# Patient Record
Sex: Female | Born: 2000 | Race: White | Hispanic: No | Marital: Single | Smoking: Never smoker
Health system: Southern US, Community
[De-identification: ages and names within clinical notes are randomized; demographics above are authoritative.]

## PROBLEM LIST (undated history)

## (undated) DIAGNOSIS — L03211 Cellulitis of face: Secondary | ICD-10-CM

---
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
## 2018-03-07 ENCOUNTER — Other Ambulatory Visit: Payer: Self-pay | Admitting: Family Medicine

## 2018-03-07 ENCOUNTER — Ambulatory Visit
Admission: RE | Admit: 2018-03-07 | Discharge: 2018-03-07 | Disposition: A | Discharge status: Home / Self Care | Payer: BLUE CROSS/BLUE SHIELD | Source: Ambulatory Visit | Attending: Family Medicine | Admitting: Family Medicine

## 2018-03-07 ENCOUNTER — Ambulatory Visit (INDEPENDENT_AMBULATORY_CARE_PROVIDER_SITE_OTHER): Payer: BLUE CROSS/BLUE SHIELD | Admitting: Family Medicine

## 2018-03-07 ENCOUNTER — Encounter: Payer: Self-pay | Admitting: Family Medicine

## 2018-03-07 DIAGNOSIS — S8990XA Unspecified injury of unspecified lower leg, initial encounter: Secondary | ICD-10-CM | POA: Diagnosis not present

## 2018-03-07 DIAGNOSIS — S8992XA Unspecified injury of left lower leg, initial encounter: Secondary | ICD-10-CM

## 2018-03-07 DIAGNOSIS — Y9366 Activity, soccer: Secondary | ICD-10-CM | POA: Diagnosis not present

## 2018-03-07 MED ORDER — DICLOFENAC SODIUM 75 MG PO TBEC: 75.0000 mg | DELAYED_RELEASE_TABLET | Freq: Two times a day (BID) | ORAL | 0 refills | Status: DC
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
## 2018-03-08 ENCOUNTER — Ambulatory Visit
Admission: RE | Admit: 2018-03-08 | Discharge: 2018-03-08 | Disposition: A | Discharge status: Home / Self Care | Payer: BLUE CROSS/BLUE SHIELD | Source: Ambulatory Visit | Attending: Family Medicine | Admitting: Family Medicine

## 2018-03-08 DIAGNOSIS — M25562 Pain in left knee: Secondary | ICD-10-CM | POA: Diagnosis not present

## 2018-03-08 DIAGNOSIS — X58XXXA Exposure to other specified factors, initial encounter: Secondary | ICD-10-CM | POA: Insufficient documentation

## 2018-03-08 DIAGNOSIS — S8992XA Unspecified injury of left lower leg, initial encounter: Secondary | ICD-10-CM | POA: Diagnosis present

## 2018-03-08 DIAGNOSIS — S8255XA Nondisplaced fracture of medial malleolus of left tibia, initial encounter for closed fracture: Secondary | ICD-10-CM | POA: Insufficient documentation

## 2018-05-10 ENCOUNTER — Other Ambulatory Visit: Payer: Self-pay | Admitting: Family Medicine

## 2018-05-10 ENCOUNTER — Other Ambulatory Visit: Payer: Self-pay

## 2018-05-10 DIAGNOSIS — R0602 Shortness of breath: Secondary | ICD-10-CM

## 2018-05-10 DIAGNOSIS — R1030 Lower abdominal pain, unspecified: Secondary | ICD-10-CM

## 2018-05-10 DIAGNOSIS — R5381 Other malaise: Secondary | ICD-10-CM

## 2018-05-11 LAB — CBC WITH DIFFERENTIAL/PLATELET
BASOS: 1 %
Basophils Absolute: 0 10*3/uL (ref 0.0–0.2)
EOS (ABSOLUTE): 0 10*3/uL (ref 0.0–0.4)
EOS: 1 %
HEMATOCRIT: 41.1 % (ref 34.0–46.6)
HEMOGLOBIN: 14 g/dL (ref 11.1–15.9)
IMMATURE GRANS (ABS): 0 10*3/uL (ref 0.0–0.1)
IMMATURE GRANULOCYTES: 0 %
LYMPHS: 30 %
Lymphocytes Absolute: 1.9 10*3/uL (ref 0.7–3.1)
MCH: 30.4 pg (ref 26.6–33.0)
MCHC: 34.1 g/dL (ref 31.5–35.7)
MCV: 89 fL (ref 79–97)
MONOS ABS: 0.4 10*3/uL (ref 0.1–0.9)
Monocytes: 6 %
NEUTROS PCT: 62 %
Neutrophils Absolute: 4.2 10*3/uL (ref 1.4–7.0)
Platelets: 304 10*3/uL (ref 150–450)
RBC: 4.61 x10E6/uL (ref 3.77–5.28)
RDW: 12.1 % — AB (ref 12.3–15.4)
WBC: 6.6 10*3/uL (ref 3.4–10.8)

## 2018-05-11 LAB — SPECIMEN STATUS REPORT

## 2018-05-12 LAB — IRON AND TIBC
IRON: 110 ug/dL (ref 27–159)
Iron Saturation: 23 % (ref 15–55)
Total Iron Binding Capacity: 469 ug/dL — ABNORMAL HIGH (ref 250–450)
UIBC: 359 ug/dL (ref 131–425)

## 2018-05-12 LAB — SPECIMEN STATUS REPORT

## 2018-05-13 LAB — VITAMIN D 25 HYDROXY (VIT D DEFICIENCY, FRACTURES): Vit D, 25-Hydroxy: 68.5 ng/mL (ref 30.0–100.0)

## 2018-05-13 LAB — TSH: TSH: 2.43 u[IU]/mL (ref 0.450–4.500)

## 2018-05-13 LAB — SPECIMEN STATUS REPORT

## 2018-05-14 LAB — CELIAC DISEASE PANEL
ENDOMYSIAL IGA: NEGATIVE
IGA/IMMUNOGLOBULIN A, SERUM: 98 mg/dL (ref 87–352)
Transglutaminase IgA: <2 U/mL (ref 0–3)

## 2018-05-14 LAB — FERRITIN: FERRITIN: 23 ng/mL (ref 15–77)

## 2018-05-14 LAB — SPECIMEN STATUS REPORT

## 2018-09-05 ENCOUNTER — Encounter: Payer: Self-pay | Admitting: Emergency Medicine

## 2018-09-05 DIAGNOSIS — H02846 Edema of left eye, unspecified eyelid: Secondary | ICD-10-CM | POA: Diagnosis present

## 2018-09-05 DIAGNOSIS — H05012 Cellulitis of left orbit: Secondary | ICD-10-CM | POA: Insufficient documentation

## 2018-09-05 NOTE — ED Triage Notes (Signed)
[name] student seen by Joseph Art, MD advised to come to ED if left eye swelling worsened. Pt was seen by her this AM and started on Sulfameth/trimethopim 800/160mg  BID. Pt has taken 2 pills today. Pt reports worsening pain and swelling to the left eye. No drainage.

## 2018-09-06 ENCOUNTER — Emergency Department
Admission: EM | Admit: 2018-09-06 | Discharge: 2018-09-06 | Disposition: A | Discharge status: Home / Self Care | Payer: BLUE CROSS/BLUE SHIELD | Attending: Emergency Medicine | Admitting: Emergency Medicine

## 2018-09-06 ENCOUNTER — Other Ambulatory Visit: Payer: Self-pay

## 2018-09-06 DIAGNOSIS — H05012 Cellulitis of left orbit: Secondary | ICD-10-CM | POA: Diagnosis not present

## 2018-09-06 DIAGNOSIS — L03213 Periorbital cellulitis: Secondary | ICD-10-CM

## 2018-09-06 LAB — CBC
HCT: 41.1 % (ref 36.0–46.0)
Hemoglobin: 13.6 g/dL (ref 12.0–15.0)
MCH: 29.1 pg (ref 26.0–34.0)
MCHC: 33.1 g/dL (ref 30.0–36.0)
MCV: 88 fL (ref 80.0–100.0)
Platelets: 229 10*3/uL (ref 150–400)
RBC: 4.67 MIL/uL (ref 3.87–5.11)
RDW: 12.9 % (ref 11.5–15.5)
WBC: 8.8 10*3/uL (ref 4.0–10.5)
nRBC: 0 % (ref 0.0–0.2)

## 2018-09-06 LAB — COMPREHENSIVE METABOLIC PANEL
ALBUMIN: 4.6 g/dL (ref 3.5–5.0)
ALT: 24 U/L (ref 0–44)
AST: 24 U/L (ref 15–41)
Alkaline Phosphatase: 52 U/L (ref 38–126)
Anion gap: 10 (ref 5–15)
BUN: 16 mg/dL (ref 6–20)
CO2: 26 mmol/L (ref 22–32)
Calcium: 9.2 mg/dL (ref 8.9–10.3)
Chloride: 102 mmol/L (ref 98–111)
Creatinine, Ser: 0.94 mg/dL (ref 0.44–1.00)
GFR calc non Af Amer: >60 mL/min (ref >60)
Glucose, Bld: 92 mg/dL (ref 70–99)
Potassium: 3.5 mmol/L (ref 3.5–5.1)
SODIUM: 138 mmol/L (ref 135–145)
Total Bilirubin: 0.4 mg/dL (ref 0.3–1.2)
Total Protein: 7.4 g/dL (ref 6.5–8.1)

## 2018-09-06 MED ORDER — CLINDAMYCIN PHOSPHATE 900 MG/50ML IV SOLN
900.0000 mg | Freq: Once | INTRAVENOUS | Status: AC
  Administered 2018-09-06: 900 mg via INTRAVENOUS
  Filled 2018-09-06: qty 50

## 2018-09-06 MED ORDER — ACETAMINOPHEN 325 MG PO TABS
650.0000 mg | ORAL_TABLET | Freq: Once | ORAL | Status: AC
  Administered 2018-09-06: 650 mg via ORAL
  Filled 2018-09-06: qty 2

## 2018-09-06 MED ORDER — MUPIROCIN 2 % EX OINT: TOPICAL_OINTMENT | CUTANEOUS | 0 refills | Status: DC

## 2018-09-06 NOTE — ED Notes (Signed)
Pt in with co left eye redness and irritation, went to [facility] and dx with staph infection. Was started on antibiotics but was told to follow up for worsening symptoms.

## 2018-09-06 NOTE — ED Notes (Signed)
Report given to Cole RN 

## 2018-09-06 NOTE — ED Provider Notes (Signed)
[facility] Emergency Department Provider Note _______   First MD Initiated Contact with Patient 09/06/18 0236     (approximate)  I have reviewed the triage vital signs and the nursing notes.   HISTORY  Chief Complaint Facial Swelling   HPI Lougenia Curlee is a 18 y.o. female presents to the emergency department with history of complete to the left side of her face which patient states that after she "popped it" area became swollen and red and has progressively worsened since that time.  Patient was seen by [facility] clinic and prescribed Bactrim DS which patient states that she has taken 2 doses thus far and that area of swelling has worsened and now extending around the left eye with associated redness and discomfort.   Past medical history Recently diagnosed left facial cellulitis at [facility] There are no active problems to display for this patient.   Past Surgical History:  Procedure Laterality Date  . KNEE SURGERY Right 05/2015    Prior to Admission medications   Medication Sig Start Date End Date Taking? Authorizing Provider  sulfamethoxazole-trimethoprim (BACTRIM DS,SEPTRA DS) 800-160 MG tablet Take 2 tablets by mouth 2 (two) times daily. 09/05/18  Yes [provider]  albuterol (PROVENTIL HFA;VENTOLIN HFA) 108 (90 Base) MCG/ACT inhaler Inhale 2 puffs into the lungs every 6 (six) hours as needed for wheezing or shortness of breath. And 30 min before exercise. Patient not taking: Reported on 03/07/2018 11/01/17   Jolene Provost, MD  diclofenac (VOLTAREN) 75 MG EC tablet Take 1 tablet (75 mg total) by mouth 2 (two) times daily. Patient not taking: Reported on 09/06/2018 03/07/18   Jolene Provost, MD    Allergies Patient has no known allergies.  History reviewed. No pertinent family history.  Social History Social History   Tobacco Use  . Smoking status: Never Smoker  . Smokeless tobacco: Never Used  Substance Use Topics  .  Alcohol use: Yes  . Drug use: Not on file    Review of Systems Constitutional: No fever/chills Eyes: No visual changes.  Positive for left facial redness swelling and tenderness. ENT: No sore throat. Cardiovascular: Denies chest pain. Respiratory: Denies shortness of breath. Gastrointestinal: No abdominal pain.  No nausea, no vomiting.  No diarrhea.  No constipation. Genitourinary: Negative for dysuria. Musculoskeletal: Negative for neck pain.  Negative for back pain. Integumentary: Negative for rash. Neurological: Negative for headaches, focal weakness or numbness.   ____________________________________________   PHYSICAL EXAM:  VITAL SIGNS: ED Triage Vitals  Enc Vitals Group     BP 09/05/18 2344 (!) 146/73     Pulse Rate 09/05/18 2344 70     Resp 09/05/18 2344 17     Temp 09/05/18 2344 97.9 F (36.6 C)     Temp Source 09/05/18 2344 Oral     SpO2 09/05/18 2344 98 %     Weight 09/05/18 2344 63.5 kg (140 lb)     Height 09/05/18 2344 1.702 m (5\' 7" )     Head Circumference --      Peak Flow --      Pain Score 09/06/18 0229 6     Pain Loc --      Pain Edu? --      Excl. in GC? --    Constitutional: Alert and oriented. Well appearing and in no acute distress. Eyes: Conjunctivae are normal.  Left periorbital edema with associated erythema.  No pain with extraocular motion Head: Left facial blanching erythema and swelling extending  from the preauricular area with circumferential edema around the left eye Mouth/Throat: Mucous membranes are moist.  Oropharynx non-erythematous. Neck: No stridor.   Cardiovascular: Normal rate, regular rhythm. Good peripheral circulation. Grossly normal heart sounds. Respiratory: Normal respiratory effort.  No retractions. Lungs CTAB. Gastrointestinal: Soft and nontender. No distention.  Musculoskeletal: No lower extremity tenderness nor edema. No gross deformities of extremities. Neurologic:  Normal speech and language. No gross focal  neurologic deficits are appreciated.  Skin: Left facial blanching erythema with associated swelling extending from the preauricular area with circumferential edema around the left eye with associated tenderness   ____________________________________________   LABS (all labs ordered are listed, but only abnormal results are displayed)  Labs Reviewed  CULTURE, BLOOD (ROUTINE X 2)  CULTURE, BLOOD (ROUTINE X 2)  CBC  COMPREHENSIVE METABOLIC PANEL    Procedures   ____________________________________________   INITIAL IMPRESSION / ASSESSMENT AND PLAN / ED COURSE  As part of my medical decision making, I reviewed the following data within the electronic MEDICAL RECORD NUMBER  18 year old female presented with above-stated history and physical exam secondary to left preseptal cellulitis worsened despite outpatient management with Bactrim.  Patient given IV clindamycin 900 mg in the emergency department.  Patient will be admitted to Dr. Sheryle Haildiamond for further evaluation and management    ____________________________________________  FINAL CLINICAL IMPRESSION(S) / ED DIAGNOSES  Final diagnoses:  Periorbital cellulitis of left eye     MEDICATIONS GIVEN DURING THIS VISIT:  Medications  clindamycin (CLEOCIN) IVPB 900 mg (0 mg Intravenous Stopped 09/06/18 0538)     ED Discharge Orders    None       Note:  This document was prepared using Dragon voice recognition software and may include unintentional dictation errors.   Darci CurrentBrown, [place], MD 09/06/18 [phone]

## 2018-09-11 LAB — CULTURE, BLOOD (ROUTINE X 2)
Culture: NO GROWTH
Culture: NO GROWTH
SPECIAL REQUESTS: ADEQUATE
Special Requests: ADEQUATE

## 2018-09-25 ENCOUNTER — Ambulatory Visit
Admission: RE | Admit: 2018-09-25 | Discharge: 2018-09-25 | Disposition: A | Discharge status: Home / Self Care | Payer: BLUE CROSS/BLUE SHIELD | Source: Home / Self Care | Attending: Family Medicine | Admitting: Family Medicine

## 2018-09-25 ENCOUNTER — Ambulatory Visit
Admission: RE | Admit: 2018-09-25 | Discharge: 2018-09-25 | Disposition: A | Discharge status: Home / Self Care | Payer: BLUE CROSS/BLUE SHIELD | Source: Ambulatory Visit | Attending: Family Medicine | Admitting: Family Medicine

## 2018-09-25 DIAGNOSIS — Z793 Long term (current) use of hormonal contraceptives: Secondary | ICD-10-CM

## 2018-09-25 DIAGNOSIS — Z832 Family history of diseases of the blood and blood-forming organs and certain disorders involving the immune mechanism: Secondary | ICD-10-CM

## 2018-09-25 DIAGNOSIS — R05 Cough: Secondary | ICD-10-CM

## 2018-09-25 DIAGNOSIS — R059 Cough, unspecified: Secondary | ICD-10-CM

## 2018-09-25 DIAGNOSIS — R079 Chest pain, unspecified: Secondary | ICD-10-CM

## 2018-09-25 DIAGNOSIS — R609 Edema, unspecified: Secondary | ICD-10-CM | POA: Diagnosis not present

## 2018-09-25 DIAGNOSIS — Z7901 Long term (current) use of anticoagulants: Secondary | ICD-10-CM

## 2018-09-25 DIAGNOSIS — Z79899 Other long term (current) drug therapy: Secondary | ICD-10-CM

## 2018-09-25 DIAGNOSIS — I2694 Multiple subsegmental pulmonary emboli without acute cor pulmonale: Secondary | ICD-10-CM | POA: Diagnosis not present

## 2018-09-26 ENCOUNTER — Ambulatory Visit (INDEPENDENT_AMBULATORY_CARE_PROVIDER_SITE_OTHER): Payer: BLUE CROSS/BLUE SHIELD | Admitting: Pulmonary Disease

## 2018-09-26 ENCOUNTER — Encounter: Payer: Self-pay | Admitting: Pulmonary Disease

## 2018-09-26 ENCOUNTER — Other Ambulatory Visit
Admission: RE | Admit: 2018-09-26 | Discharge: 2018-09-26 | Disposition: A | Discharge status: Home / Self Care | Payer: BLUE CROSS/BLUE SHIELD | Source: Ambulatory Visit | Attending: Pulmonary Disease | Admitting: Pulmonary Disease

## 2018-09-26 ENCOUNTER — Ambulatory Visit (INDEPENDENT_AMBULATORY_CARE_PROVIDER_SITE_OTHER): Payer: BLUE CROSS/BLUE SHIELD | Admitting: Family Medicine

## 2018-09-26 ENCOUNTER — Other Ambulatory Visit: Payer: Self-pay

## 2018-09-26 ENCOUNTER — Ambulatory Visit
Admission: RE | Admit: 2018-09-26 | Discharge: 2018-09-26 | Disposition: A | Discharge status: Home / Self Care | Payer: BLUE CROSS/BLUE SHIELD | Source: Ambulatory Visit | Attending: Pulmonary Disease | Admitting: Pulmonary Disease

## 2018-09-26 ENCOUNTER — Inpatient Hospital Stay: Payer: BLUE CROSS/BLUE SHIELD

## 2018-09-26 ENCOUNTER — Inpatient Hospital Stay
Admission: EM | Admit: 2018-09-26 | Discharge: 2018-09-28 | DRG: 176 | Disposition: A | Discharge status: Home / Self Care | Payer: BLUE CROSS/BLUE SHIELD | Attending: Internal Medicine | Admitting: Internal Medicine

## 2018-09-26 ENCOUNTER — Encounter: Payer: Self-pay | Admitting: Emergency Medicine

## 2018-09-26 ENCOUNTER — Other Ambulatory Visit: Payer: Self-pay | Admitting: Family Medicine

## 2018-09-26 ENCOUNTER — Other Ambulatory Visit: Admission: RE | Admit: 2018-09-26 | Payer: Self-pay | Source: Ambulatory Visit | Admitting: *Deleted

## 2018-09-26 VITALS — BP 138/60 | HR 77 | Wt 156.6 lb

## 2018-09-26 VITALS — BP 134/67 | HR 55 | Temp 97.8°F | Resp 14

## 2018-09-26 DIAGNOSIS — R042 Hemoptysis: Secondary | ICD-10-CM

## 2018-09-26 DIAGNOSIS — R609 Edema, unspecified: Secondary | ICD-10-CM

## 2018-09-26 DIAGNOSIS — Z79899 Other long term (current) drug therapy: Secondary | ICD-10-CM | POA: Diagnosis not present

## 2018-09-26 DIAGNOSIS — Z01818 Encounter for other preprocedural examination: Secondary | ICD-10-CM | POA: Diagnosis not present

## 2018-09-26 DIAGNOSIS — Z7901 Long term (current) use of anticoagulants: Secondary | ICD-10-CM | POA: Diagnosis not present

## 2018-09-26 DIAGNOSIS — Z793 Long term (current) use of hormonal contraceptives: Secondary | ICD-10-CM | POA: Diagnosis not present

## 2018-09-26 DIAGNOSIS — I2694 Multiple subsegmental pulmonary emboli without acute cor pulmonale: Secondary | ICD-10-CM | POA: Diagnosis present

## 2018-09-26 DIAGNOSIS — I2699 Other pulmonary embolism without acute cor pulmonale: Secondary | ICD-10-CM

## 2018-09-26 DIAGNOSIS — Z832 Family history of diseases of the blood and blood-forming organs and certain disorders involving the immune mechanism: Secondary | ICD-10-CM | POA: Diagnosis not present

## 2018-09-26 HISTORY — DX: Cellulitis of face: L03.211

## 2018-09-26 LAB — BASIC METABOLIC PANEL
ANION GAP: 9 (ref 5–15)
BUN: 15 mg/dL (ref 6–20)
CO2: 25 mmol/L (ref 22–32)
Calcium: 8.9 mg/dL (ref 8.9–10.3)
Chloride: 104 mmol/L (ref 98–111)
Creatinine, Ser: 0.77 mg/dL (ref 0.44–1.00)
GFR calc non Af Amer: >60 mL/min (ref >60)
Glucose, Bld: 90 mg/dL (ref 70–99)
Potassium: 3.6 mmol/L (ref 3.5–5.1)
Sodium: 138 mmol/L (ref 135–145)

## 2018-09-26 LAB — PROTIME-INR
INR: 0.9 (ref 0.8–1.2)
PROTHROMBIN TIME: 11.6 s (ref 11.4–15.2)

## 2018-09-26 LAB — CBC
HCT: 38.5 % (ref 36.0–46.0)
Hemoglobin: 12.8 g/dL (ref 12.0–15.0)
MCH: 29.9 pg (ref 26.0–34.0)
MCHC: 33.2 g/dL (ref 30.0–36.0)
MCV: 90 fL (ref 80.0–100.0)
PLATELETS: 316 10*3/uL (ref 150–400)
RBC: 4.28 MIL/uL (ref 3.87–5.11)
RDW: 13.5 % (ref 11.5–15.5)
WBC: 7.2 10*3/uL (ref 4.0–10.5)
nRBC: 0 % (ref 0.0–0.2)

## 2018-09-26 LAB — TROPONIN I: Troponin I: <0.03 ng/mL (ref <0.03)

## 2018-09-26 LAB — PREGNANCY, URINE: Preg Test, Ur: NEGATIVE

## 2018-09-26 LAB — APTT: aPTT: 28 seconds (ref 24–36)

## 2018-09-26 MED ORDER — HYDROCODONE-ACETAMINOPHEN 5-325 MG PO TABS: 1.0000 | ORAL_TABLET | ORAL | Status: DC | PRN

## 2018-09-26 MED ORDER — IOHEXOL 350 MG/ML SOLN
75.0000 mL | Freq: Once | INTRAVENOUS | Status: AC | PRN
  Administered 2018-09-26: 75 mL via INTRAVENOUS

## 2018-09-26 MED ORDER — SODIUM CHLORIDE 0.9% FLUSH: 3.0000 mL | Freq: Two times a day (BID) | INTRAVENOUS | Status: DC

## 2018-09-26 MED ORDER — HEPARIN (PORCINE) 25000 UT/250ML-% IV SOLN
1150.0000 [IU]/h | INTRAVENOUS | Status: AC
  Administered 2018-09-26 – 2018-09-27 (×2): 1150 [IU]/h via INTRAVENOUS
  Filled 2018-09-26 (×2): qty 250

## 2018-09-26 MED ORDER — ACETAMINOPHEN 650 MG RE SUPP: 650.0000 mg | Freq: Four times a day (QID) | RECTAL | Status: DC | PRN

## 2018-09-26 MED ORDER — ONDANSETRON HCL 4 MG PO TABS: 4.0000 mg | ORAL_TABLET | Freq: Four times a day (QID) | ORAL | Status: DC | PRN

## 2018-09-26 MED ORDER — VITAMIN B-12 1000 MCG PO TABS
1000.0000 ug | ORAL_TABLET | Freq: Every day | ORAL | Status: DC
  Administered 2018-09-27: 1000 ug via ORAL
  Filled 2018-09-26: qty 1

## 2018-09-26 MED ORDER — ONDANSETRON HCL 4 MG/2ML IJ SOLN: 4.0000 mg | Freq: Four times a day (QID) | INTRAMUSCULAR | Status: DC | PRN

## 2018-09-26 MED ORDER — SODIUM CHLORIDE 0.9 % IV SOLN: 250.0000 mL | INTRAVENOUS | Status: DC | PRN

## 2018-09-26 MED ORDER — HEPARIN BOLUS VIA INFUSION
4000.0000 [IU] | Freq: Once | INTRAVENOUS | Status: AC
  Administered 2018-09-26: 4000 [IU] via INTRAVENOUS
  Filled 2018-09-26: qty 4000

## 2018-09-26 MED ORDER — ACETAMINOPHEN 325 MG PO TABS
650.0000 mg | ORAL_TABLET | Freq: Four times a day (QID) | ORAL | Status: DC | PRN
  Administered 2018-09-27 (×2): 650 mg via ORAL
  Filled 2018-09-26 (×2): qty 2

## 2018-09-26 MED ORDER — SODIUM CHLORIDE 0.9% FLUSH: 3.0000 mL | INTRAVENOUS | Status: DC | PRN

## 2018-09-26 NOTE — Consult Note (Signed)
ANTICOAGULATION CONSULT NOTE - Initial Consult  Pharmacy Consult for Heparin Infusion  Indication: pulmonary embolus  No Known Allergies  Patient Measurements: Height: 5\' 7"  (170.2 cm) Weight: 150 lb (68 kg) IBW/kg (Calculated) : 61.6  Vital Signs: Temp: 97.8 F (36.6 C) (02/27 0952) Temp Source: Oral (02/27 0952) BP: 148/72 (02/27 1836) Pulse Rate: 68 (02/27 1836)  Labs: Recent Labs    09/26/18 1823  HGB 12.8  HCT 38.5  PLT 316  LABPROT 11.6  INR 0.9  CREATININE 0.77  TROPONINI <0.03    Estimated Creatinine Clearance: 110 mL/min (by C-G formula based on SCr of 0.77 mg/dL).  Medical History: History reviewed. No pertinent past medical history.  Assessment: Pharmacy has been consulted for heparin drip dosing and monitoring in 18 yo female admitted w/ PE.   Goal of Therapy:  Heparin level 0.3-0.7 units/ml Monitor platelets by anticoagulation protocol: Yes   Plan:  Baseline labs ordered.  Give 4000 units bolus x 1 Start heparin infusion at 1150 units/hr Check anti-Xa level in 6 hours and daily while on heparin Continue to monitor H&H and platelets  Gardner Candle, PharmD, BCPS Clinical Pharmacist 09/26/2018 7:06 PM

## 2018-09-26 NOTE — ED Triage Notes (Addendum)
Patient presents to the ED after going to CT scan and results were positive for a PE.  Patient is in no obvious distress at this time.  Patient reports difficulty breathing that started Friday.  Patient reports family history of blood clots.  Patient states her pain is in her right shoulder, neck and back.

## 2018-09-26 NOTE — Patient Instructions (Signed)
1. We will get a CT chest to exclude blood clot to the lung

## 2018-09-26 NOTE — H&P (Signed)
[facility] [place] [facility]   PATIENT NAME: Melinda Robles    MR#:  [id number]  DATE OF BIRTH:  2001-01-21  DATE OF ADMISSION:  09/26/2018  PRIMARY CARE PHYSICIAN: Jolene Provost, MD   REQUESTING/REFERRING PHYSICIAN: Dr. Franco Collet  CHIEF COMPLAINT:   Chief Complaint  Patient presents with  . Chest Pain    HISTORY OF PRESENT ILLNESS: Melinda Robles  is a 18 y.o. female with a known history of cellulitis of the face who has recently been on birth control pills until few days ago which she stopped taking those.  Also has a family history of protein S deficiency presenting to the emergency room with complaint of shortness of breath since yesterday.  Patient states that she also started having coughing up some blood which was scant amount since yesterday.  She was evaluated in the ER and was noted to have pulmonary embolism and findings suggestive of possible lung infarct.  The emergency room physician spoke to the pulmonologist who recommended patient be admitted and placed on a heparin drip due to hemoptysis to make sure the patient does not have further hemoptysis.  Patient otherwise denies any chest pain.    PAST MEDICAL HISTORY:   Past Medical History:  Diagnosis Date  . Cellulitis of face     PAST SURGICAL HISTORY:  Past Surgical History:  Procedure Laterality Date  . KNEE SURGERY Right 05/2015    SOCIAL HISTORY:  Social History   Tobacco Use  . Smoking status: Never Smoker  . Smokeless tobacco: Never Used  Substance Use Topics  . Alcohol use: Yes    FAMILY HISTORY:  Family History  Problem Relation Age of Onset  . Protein S deficiency Mother     DRUG ALLERGIES: No Known Allergies  REVIEW OF SYSTEMS:   CONSTITUTIONAL: No fever, fatigue or weakness.  EYES: No blurred or double vision.  EARS, NOSE, AND THROAT: No tinnitus or ear pain.  RESPIRATORY: No cough, positive shortness of breath, no wheezing or hemoptysis.  CARDIOVASCULAR: No chest pain,  orthopnea, edema.  GASTROINTESTINAL: No nausea, vomiting, diarrhea or abdominal pain.  GENITOURINARY: No dysuria, hematuria.  ENDOCRINE: No polyuria, nocturia,  HEMATOLOGY: No anemia, easy bruising or bleeding SKIN: No rash or lesion. MUSCULOSKELETAL: No joint pain or arthritis.   NEUROLOGIC: No tingling, numbness, weakness.  PSYCHIATRY: No anxiety or depression.   MEDICATIONS AT HOME:  Prior to Admission medications   Medication Sig Start Date End Date Taking? Authorizing Provider  Norgestimate-Ethinyl Estradiol Triphasic (TRI-LO-SPRINTEC) 0.18/0.215/0.25 MG-25 MCG tab Take 1 tablet by mouth daily.    Yes [provider]  vitamin B-12 (CYANOCOBALAMIN) 1000 MCG tablet Take 1,000 mcg by mouth daily.   Yes [provider]  mupirocin ointment (BACTROBAN) 2 % Apply to affected area 3 times daily 09/06/18   Emily Filbert, MD      PHYSICAL EXAMINATION:   VITAL SIGNS: Blood pressure (!) 148/72, pulse 68, resp. rate 16, height 5\' 7"  (1.702 m), weight 68 kg, last menstrual period 08/30/2018, SpO2 99 %.  GENERAL:  18 y.o.-year-old patient lying in the bed with no acute distress.  EYES: Pupils equal, round, reactive to light and accommodation. No scleral icterus. Extraocular muscles intact.  HEENT: Head atraumatic, normocephalic. Oropharynx and nasopharynx clear.  NECK:  Supple, no jugular venous distention. No thyroid enlargement, no tenderness.  LUNGS: Normal breath sounds bilaterally, no wheezing, rales,rhonchi or crepitation. No use of accessory muscles of respiration.  CARDIOVASCULAR: S1, S2 normal. No murmurs, rubs,  or gallops.  ABDOMEN: Soft, nontender, nondistended. Bowel sounds present. No organomegaly or mass.  EXTREMITIES: No pedal edema, cyanosis, or clubbing.  NEUROLOGIC: Cranial nerves II through XII are intact. Muscle strength 5/5 in all extremities. Sensation intact. Gait not checked.  PSYCHIATRIC: The patient is alert and oriented x 3.  SKIN: No obvious  rash, lesion, or ulcer.   LABORATORY PANEL:   CBC Recent Labs  Lab 09/26/18 1823  WBC 7.2  HGB 12.8  HCT 38.5  PLT 316  MCV 90.0  MCH 29.9  MCHC 33.2  RDW 13.5   ------------------------------------------------------------------------------------------------------------------  Chemistries  Recent Labs  Lab 09/26/18 1823  NA 138  K 3.6  CL 104  CO2 25  GLUCOSE 90  BUN 15  CREATININE 0.77  CALCIUM 8.9   ------------------------------------------------------------------------------------------------------------------ estimated creatinine clearance is 110 mL/min (by C-G formula based on SCr of 0.77 mg/dL). ------------------------------------------------------------------------------------------------------------------ No results for input(s): TSH, T4TOTAL, T3FREE, THYROIDAB in the last 72 hours.  Invalid input(s): FREET3   Coagulation profile Recent Labs  Lab 09/26/18 1823  INR 0.9   ------------------------------------------------------------------------------------------------------------------- No results for input(s): DDIMER in the last 72 hours. -------------------------------------------------------------------------------------------------------------------  Cardiac Enzymes Recent Labs  Lab 09/26/18 1823  TROPONINI <0.03   ------------------------------------------------------------------------------------------------------------------ Invalid input(s): POCBNP  ---------------------------------------------------------------------------------------------------------------  Urinalysis No results found for: COLORURINE, APPEARANCEUR, LABSPEC, PHURINE, GLUCOSEU, HGBUR, BILIRUBINUR, KETONESUR, PROTEINUR, UROBILINOGEN, NITRITE, LEUKOCYTESUR   RADIOLOGY: Dg Chest 2 View  Result Date: 09/25/2018 CLINICAL DATA:  Cough and difficulty breathing EXAM: CHEST - 2 VIEW COMPARISON:  None. FINDINGS: Lungs are clear. The heart size and pulmonary vascularity are  normal. No adenopathy. There is mild upper thoracic levoscoliosis. IMPRESSION: No edema or consolidation. Electronically Signed   By: Bretta Bang III M.D.   On: 09/25/2018 14:42   Ct Angio Chest Pe W Or Wo Contrast  Result Date: 09/26/2018 CLINICAL DATA:  Hemoptysis x 3 days, SOB, recent staph. Infection to face, Pt is on Birth control EXAM: CT ANGIOGRAPHY CHEST WITH CONTRAST TECHNIQUE: Multidetector CT imaging of the chest was performed using the standard protocol during bolus administration of intravenous contrast. Multiplanar CT image reconstructions and MIPs were obtained to evaluate the vascular anatomy. CONTRAST:  75mL OMNIPAQUE IOHEXOL 350 MG/ML SOLN COMPARISON:  Chest radiographs, 09/25/2018 FINDINGS: Cardiovascular: There is satisfactory opacification of the pulmonary arteries to the segmental level. There are several small pulmonary emboli. Pulmonary emboli are noted at the branch point of the right lower lobe pulmonary artery with a discrete pulmonary embolus noted in the lateral segmental branch to the right lower lobe. On the left, there is a pulmonary embolus to a lingular branch segmental pulmonary artery. No other definite pulmonary emboli. Heart is normal in size and configuration. No pericardial effusion. Great vessels are within normal limits. Mediastinum/Nodes: No enlarged mediastinal, hilar, or axillary lymph nodes. Thyroid gland, trachea, and esophagus demonstrate no significant findings. Lungs/Pleura: Small right pleural effusion. There is a small area of airspace opacity at the lateral right lung base consistent with atelectasis or possibly infarction. There is a small patchy area of opacity in the posterolateral left lung base consistent with atelectasis. Lungs otherwise clear. No left pleural effusion. No pneumothorax. Upper Abdomen: Unremarkable. Musculoskeletal: No chest wall abnormality. No acute or significant osseous findings. Review of the MIP images confirms the above  findings. IMPRESSION: 1. Small segmental pulmonary emboli, right lower lobe and left upper lobe lingula, as detailed. 2. Mild lateral right lower lobe airspace opacity the small area posterolateral left lung base opacity. This is most  likely atelectasis. Infarction on the right is possible. 3. Small right pleural effusion. Electronically Signed   By: Amie Portland M.D.   On: 09/26/2018 17:50    EKG: Orders placed or performed during the hospital encounter of 09/26/18  . ED EKG  . ED EKG    IMPRESSION AND PLAN: Patient is 18 year old with no significant medical history presenting with shortness of breath  1.  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  Hemoptysis due to pulmonary infarct monitor CBC    All the records are reviewed and case discussed with ED provider. Management plans discussed with the patient, family and they are in agreement.  CODE STATUS: Full    TOTAL TIME TAKING CARE OF THIS PATIENT: 55 minutes.    Auburn Bilberry M.D on 09/26/2018 at 8:04 PM  Between 7am to 6pm - Pager - [phone]  After 6pm go to [email or web address] - password [email or web address]  [employer] [facility] Office  [phone]  CC: Primary care physician; Jolene Provost, MD

## 2018-09-26 NOTE — ED Provider Notes (Signed)
[facility] Emergency Department Provider Note ____________________________________________   First MD Initiated Contact with Patient 09/26/18 2003     (approximate)  I have reviewed the triage vital signs and the nursing notes.   HISTORY  Chief Complaint Chest Pain    HPI Melinda Robles is a 17 y.o. female with PMH as noted below who presents with hemoptysis, referred to the hospital for pulmonary embolism.  The patient states that she has had some back pain on the right, intermittent, associated with shortness of breath, and worse with exertion over the last several days.  She has had a small volume of hemoptysis since yesterday.  The patient saw a pulmonologist today and was referred for CT which was positive for pulmonary embolism.  She has a family history of protein S deficiency but has not been tested herself.   Past Medical History:  Diagnosis Date  . Cellulitis of face     Patient Active Problem List   Diagnosis Date Noted  . Pulmonary emboli (HCC) 09/26/2018    Past Surgical History:  Procedure Laterality Date  . KNEE SURGERY Right 05/2015    Prior to Admission medications   Medication Sig Start Date End Date Taking? Authorizing Provider  Norgestimate-Ethinyl Estradiol Triphasic (TRI-LO-SPRINTEC) 0.18/0.215/0.25 MG-25 MCG tab Take 1 tablet by mouth daily.    Yes [provider]  vitamin B-12 (CYANOCOBALAMIN) 1000 MCG tablet Take 1,000 mcg by mouth daily.   Yes [provider]  mupirocin ointment (BACTROBAN) 2 % Apply to affected area 3 times daily 09/06/18   Emily Filbert, MD    Allergies Patient has no known allergies.  Family History  Problem Relation Age of Onset  . Protein S deficiency Mother     Social History Social History   Tobacco Use  . Smoking status: Never Smoker  . Smokeless tobacco: Never Used  Substance Use Topics  . Alcohol use: Yes  . Drug use: Not on file    Review of  Systems  Constitutional: No fever. Eyes: No redness. ENT: No sore throat. Cardiovascular: Denies chest pain. Respiratory: Positive for intermittent shortness of breath. Gastrointestinal: No vomiting or diarrhea.  Genitourinary: Negative for flank pain.  Musculoskeletal: Positive for back pain. Skin: Negative for rash. Neurological: Negative for headache.   ____________________________________________   PHYSICAL EXAM:  VITAL SIGNS: ED Triage Vitals  Enc Vitals Group     BP 09/26/18 1836 (!) 148/72     Pulse Rate 09/26/18 1836 68     Resp 09/26/18 1836 16     Temp --      Temp src --      SpO2 09/26/18 1836 99 %     Weight 09/26/18 1817 150 lb (68 kg)     Height 09/26/18 1817 5\' 7"  (1.702 m)     Head Circumference --      Peak Flow --      Pain Score 09/26/18 1817 5     Pain Loc --      Pain Edu? --      Excl. in GC? --     Constitutional: Alert and oriented. Well appearing and in no acute distress. Eyes: Conjunctivae are normal.  Head: Atraumatic. Nose: No congestion/rhinnorhea. Mouth/Throat: Mucous membranes are moist.   Neck: Normal range of motion.  Cardiovascular:  Good peripheral circulation. Respiratory: Normal respiratory effort.   Gastrointestinal: No distention.  Musculoskeletal: No lower extremity edema.  No calf or popliteal swelling or tenderness.  Extremities warm and well  perfused.  Neurologic:  Normal speech and language. No gross focal neurologic deficits are appreciated.  Skin:  Skin is warm and dry. No rash noted. Psychiatric: Mood and affect are normal. Speech and behavior are normal.  ____________________________________________   LABS (all labs ordered are listed, but only abnormal results are displayed)  Labs Reviewed  BASIC METABOLIC PANEL  CBC  TROPONIN I  PROTIME-INR  APTT  ANTITHROMBIN III  PROTEIN C, TOTAL  PROTEIN S PANEL  FACTOR 5 LEIDEN  ANTIPHOSPHOLIPID SYNDROME EVAL, BLD  HEPARIN LEVEL (UNFRACTIONATED)  CBC    ____________________________________________  EKG  ED ECG REPORT I, Dionne Bucy, the attending physician, personally viewed and interpreted this ECG.  Date: 09/26/2018 EKG Time: 1825 Rate: 63 Rhythm: normal sinus rhythm QRS Axis: Right axis Intervals: normal ST/T Wave abnormalities: normal Narrative Interpretation: no evidence of acute ischemia  ____________________________________________  RADIOLOGY    ____________________________________________   PROCEDURES  Procedure(s) performed: No  Procedures  Critical Care performed: No ____________________________________________   INITIAL IMPRESSION / ASSESSMENT AND PLAN / ED COURSE  Pertinent labs & imaging results that were available during my care of the patient were reviewed by me and considered in my medical decision making (see chart for details).  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  The patient has had some right-sided back pain and exertional shortness of breath over the last several days, and small volume of hemoptysis since yesterday.  On exam the vital signs are normal and the patient is well-appearing.  The remainder of the exam is unremarkable.  EKG shows no acute findings.  I discussed the case with Dr. Belia Heman from pulmonology.  Since the CT shows bilateral subsegmental embolism with some possible areas of infarction, as well as the presence of the hemoptysis he recommends admission and to start the patient on a heparin infusion to be able to discontinue it quickly if necessary.  We obtained hypercoagulability lab work-up in the ED and started the heparin.  I signed the patient out to the hospitalist.   ____________________________________________   FINAL CLINICAL IMPRESSION(S) / ED DIAGNOSES  Final diagnoses:  Multiple subsegmental pulmonary emboli without acute cor pulmonale   Hemoptysis      NEW MEDICATIONS STARTED DURING THIS VISIT:  New Prescriptions   No medications on file     Note:  This document was prepared using Dragon voice recognition software and may include unintentional dictation errors.    Dionne Bucy, MD 09/26/18 2021

## 2018-09-26 NOTE — ED Notes (Signed)
ED TO INPATIENT HANDOFF REPORT  Name/Age/Gender Melinda Robles 18 y.o. female  Code Status   Home/SNF/Other Home  Chief Complaint Poss pulmonary embolism  Level of Care/Admitting Diagnosis ED Disposition    ED Disposition Condition Comment   Admit  Hospital Area: [facility] [[id number]]  Level of Care: Med-Surg [16]  Diagnosis: Pulmonary emboli [facility]) [[id number]]  Admitting Physician: Auburn Bilberry [[id number]]  Attending Physician: Auburn Bilberry [[id number]]  Estimated length of stay: past midnight tomorrow  Certification:: I certify this patient will need inpatient services for at least 2 midnights  PT Class (Do Not Modify): Inpatient [101]  PT Acc Code (Do Not Modify): Private [1]       Medical History Past Medical History:  Diagnosis Date  . Cellulitis of face     Allergies No Known Allergies  IV Location/Drains/Wounds Patient Lines/Drains/Airways Status   Active Line/Drains/Airways    Name:   Placement date:   Placement time:   Site:   Days:   Peripheral IV 09/26/18 Right Antecubital   09/26/18    1720    Antecubital   less than 1   Peripheral IV 09/26/18 Left Antecubital   09/26/18    1900    Antecubital   less than 1          Labs/Imaging Results for orders placed or performed during the hospital encounter of 09/26/18 (from the past 48 hour(s))  Basic metabolic panel     Status: None   Collection Time: 09/26/18  6:23 PM  Result Value Ref Range   Sodium 138 135 - 145 mmol/L   Potassium 3.6 3.5 - 5.1 mmol/L   Chloride 104 98 - 111 mmol/L   CO2 25 22 - 32 mmol/L   Glucose, Bld 90 70 - 99 mg/dL   BUN 15 6 - 20 mg/dL   Creatinine, Ser 1.01 0.44 - 1.00 mg/dL   Calcium 8.9 8.9 - 75.1 mg/dL   GFR calc non Af Amer >60 >60 mL/min   GFR calc Af Amer >60 >60 mL/min   Anion gap 9 5 - 15    Comment: Performed at [facility], [address] [place], [place] [address]  CBC     Status: None   Collection Time: 09/26/18  6:23 PM  Result  Value Ref Range   WBC 7.2 4.0 - 10.5 K/uL   RBC 4.28 3.87 - 5.11 MIL/uL   Hemoglobin 12.8 12.0 - 15.0 g/dL   HCT 27.7 82.4 - 23.5 %   MCV 90.0 80.0 - 100.0 fL   MCH 29.9 26.0 - 34.0 pg   MCHC 33.2 30.0 - 36.0 g/dL   RDW 36.1 44.3 - 15.4 %   Platelets 316 150 - 400 K/uL   nRBC 0.0 0.0 - 0.2 %    Comment: Performed at [facility], [address] [place], [place] [address]  Troponin I - ONCE - STAT     Status: None   Collection Time: 09/26/18  6:23 PM  Result Value Ref Range   Troponin I <0.03 <0.03 ng/mL    Comment: Performed at [facility], [address] [place], [place] [address]  Protime-INR (order if Patient is taking Coumadin / Warfarin)     Status: None   Collection Time: 09/26/18  6:23 PM  Result Value Ref Range   Prothrombin Time 11.6 11.4 - 15.2 seconds   INR 0.9 0.8 - 1.2    Comment: (NOTE) INR goal varies based on device and disease states.  Performed at [facility], [address] [place], [place] [address]   APTT     Status: None   Collection Time: 09/26/18  7:06 PM  Result Value Ref Range   aPTT 28 24 - 36 seconds    Comment: Performed at [facility], [address] [place], [place] [address]   Dg Chest 2 View  Result Date: 09/25/2018 CLINICAL DATA:  Cough and difficulty breathing EXAM: CHEST - 2 VIEW COMPARISON:  None. FINDINGS: Lungs are clear. The heart size and pulmonary vascularity are normal. No adenopathy. There is mild upper thoracic levoscoliosis. IMPRESSION: No edema or consolidation. Electronically Signed   By: Bretta Bang III M.D.   On: 09/25/2018 14:42   Ct Angio Chest Pe W Or Wo Contrast  Result Date: 09/26/2018 CLINICAL DATA:  Hemoptysis x 3 days, SOB, recent staph. Infection to face, Pt is on Birth control EXAM: CT ANGIOGRAPHY CHEST WITH CONTRAST TECHNIQUE: Multidetector CT imaging of the chest was performed using the standard protocol during bolus administration of intravenous contrast.  Multiplanar CT image reconstructions and MIPs were obtained to evaluate the vascular anatomy. CONTRAST:  78mL OMNIPAQUE IOHEXOL 350 MG/ML SOLN COMPARISON:  Chest radiographs, 09/25/2018 FINDINGS: Cardiovascular: There is satisfactory opacification of the pulmonary arteries to the segmental level. There are several small pulmonary emboli. Pulmonary emboli are noted at the branch point of the right lower lobe pulmonary artery with a discrete pulmonary embolus noted in the lateral segmental branch to the right lower lobe. On the left, there is a pulmonary embolus to a lingular branch segmental pulmonary artery. No other definite pulmonary emboli. Heart is normal in size and configuration. No pericardial effusion. Great vessels are within normal limits. Mediastinum/Nodes: No enlarged mediastinal, hilar, or axillary lymph nodes. Thyroid gland, trachea, and esophagus demonstrate no significant findings. Lungs/Pleura: Small right pleural effusion. There is a small area of airspace opacity at the lateral right lung base consistent with atelectasis or possibly infarction. There is a small patchy area of opacity in the posterolateral left lung base consistent with atelectasis. Lungs otherwise clear. No left pleural effusion. No pneumothorax. Upper Abdomen: Unremarkable. Musculoskeletal: No chest wall abnormality. No acute or significant osseous findings. Review of the MIP images confirms the above findings. IMPRESSION: 1. Small segmental pulmonary emboli, right lower lobe and left upper lobe lingula, as detailed. 2. Mild lateral right lower lobe airspace opacity the small area posterolateral left lung base opacity. This is most likely atelectasis. Infarction on the right is possible. 3. Small right pleural effusion. Electronically Signed   By: Amie Portland M.D.   On: 09/26/2018 17:50    Pending Labs Unresulted Labs (From admission, onward)    Start     Ordered   09/27/18 0500  CBC  Tomorrow morning,   STAT      09/26/18 1942   09/27/18 0130  Heparin level (unfractionated)  Once-Timed,   STAT     09/26/18 1942   09/26/18 1846  Antiphospholipid syndrome eval, bld  Once,   STAT     09/26/18 1845   09/26/18 1845  Antithrombin III  Once,   STAT     09/26/18 1845   09/26/18 1845  Protein C, total  Once,   STAT     09/26/18 1845   09/26/18 1845  PROTEIN S PANEL  Once,   STAT     09/26/18 1845   09/26/18 1845  Factor 5 leiden  Once,   STAT     09/26/18 1845  Signed and Held  CBC  Tomorrow morning,   R     Signed and Held   Signed and Held  Basic metabolic panel  Tomorrow morning,   R     Signed and Held          Vitals/Pain Today's Vitals   09/26/18 1817 09/26/18 1836 09/26/18 1849  BP:  (!) 148/72   Pulse:  68   Resp:  16   SpO2:  99%   Weight: 68 kg    Height:  (1.702 m)    PainSc: 5   0-No pain    Isolation Precautions No active isolations  Medications Medications  heparin bolus via infusion 4,000 Units (4,000 Units Intravenous Bolus from Bag 09/26/18 1925)    Followed by  heparin ADULT infusion 100 units/mL (25000 units/256mL sodium chloride 0.45%) (1,150 Units/hr Intravenous New Bag/Given 09/26/18 1925)    Mobility walks

## 2018-09-26 NOTE — Progress Notes (Signed)
   Subjective:    Patient ID: Melinda Robles, female    DOB: [birth year], 18 y.o.   MRN: [id number]  HPI This is a 18 year old never smoker who presents for evaluation of hemoptysis.  She is referred by Dr. Allena Katz.  Patient is a Archivist at [employer], she is a varsity Database administrator.  She had an evaluation for cellulitis of her face  on 7 February.  She was treated for that without any incident.  Approximately 2 to 3 days prior to this evaluation she had noticed pleuritic chest pain and associated dyspnea.  She believed that this was a muscle strain and had her trainer initiated some physiotherapy and massage which helped initially.  The patient however persisted having some pleuritic chest pain and approximately 2 days prior to this evaluation she developed hemoptysis of bright red clots.  She has not had any purulent sputum production associated with this.  No fevers, chills or sweats.  He has not noted any lower extremity edema and no calf tenderness.  She has been on birth control up to 2 days prior to this evaluation.  Past medical history, surgical history and family history has been reviewed.  Her family history is positive for pulmonary embolism and her father's side of the family.  To her knowledge this has not been previously but he.   Review of Systems     Objective:   Physical Exam Vitals signs and nursing note reviewed.  Constitutional:      Appearance: She is well-developed and normal weight.  HENT:     Head: Normocephalic and atraumatic.     Mouth/Throat:     Mouth: Mucous membranes are moist.  Eyes:     Extraocular Movements: Extraocular movements intact.     Pupils: Pupils are equal, round, and reactive to light.  Neck:     Musculoskeletal: Neck supple.     Vascular: No JVD.     Trachea: No tracheal deviation.  Cardiovascular:     Rate and Rhythm: Normal rate.     Pulses: Normal pulses.     Heart sounds: Normal heart sounds.  Pulmonary:     Effort: Pulmonary  effort is normal.     Breath sounds: Normal breath sounds.  Chest:     Chest wall: No tenderness.  Abdominal:     General: There is no distension.  Musculoskeletal: Normal range of motion.     Right lower leg: She exhibits no tenderness. No edema.     Left lower leg: She exhibits no tenderness. No edema.  Skin:    General: Skin is warm and dry.  Neurological:     General: No focal deficit present.     Mental Status: She is alert and oriented to person, place, and time.  Psychiatric:        Mood and Affect: Mood normal.        Behavior: Behavior normal.            Assessment & Plan:   1.  Pleuritic chest pain and hemoptysis: This patient has been on birth control she has a family history of PE and blood clots PE has to be ruled out.  Will obtain CT angio chest to rule out PE.  Further recommendations pending results of this test.  Other possibilities for the hemoptysis include infection however she has not had symptoms of infection at all.  2.  Dyspnea: Evaluate with CT as above.

## 2018-09-27 ENCOUNTER — Inpatient Hospital Stay: Payer: BLUE CROSS/BLUE SHIELD

## 2018-09-27 LAB — BASIC METABOLIC PANEL
Anion gap: 13 (ref 5–15)
BUN: 14 mg/dL (ref 6–20)
CO2: 22 mmol/L (ref 22–32)
Calcium: 8.8 mg/dL — ABNORMAL LOW (ref 8.9–10.3)
Chloride: 105 mmol/L (ref 98–111)
Creatinine, Ser: 0.7 mg/dL (ref 0.44–1.00)
GFR calc Af Amer: >60 mL/min (ref >60)
GFR calc non Af Amer: >60 mL/min (ref >60)
Glucose, Bld: 122 mg/dL — ABNORMAL HIGH (ref 70–99)
Potassium: 3.4 mmol/L — ABNORMAL LOW (ref 3.5–5.1)
SODIUM: 140 mmol/L (ref 135–145)

## 2018-09-27 LAB — CBC
HCT: 36.5 % (ref 36.0–46.0)
Hemoglobin: 12.2 g/dL (ref 12.0–15.0)
MCH: 29.8 pg (ref 26.0–34.0)
MCHC: 33.4 g/dL (ref 30.0–36.0)
MCV: 89.2 fL (ref 80.0–100.0)
Platelets: 306 10*3/uL (ref 150–400)
RBC: 4.09 MIL/uL (ref 3.87–5.11)
RDW: 13.4 % (ref 11.5–15.5)
WBC: 7.6 10*3/uL (ref 4.0–10.5)
nRBC: 0 % (ref 0.0–0.2)

## 2018-09-27 LAB — HEPARIN LEVEL (UNFRACTIONATED)
Heparin Unfractionated: 0.56 IU/mL (ref 0.30–0.70)
Heparin Unfractionated: 0.68 IU/mL (ref 0.30–0.70)
Heparin Unfractionated: 0.46 IU/mL (ref 0.30–0.70)

## 2018-09-27 NOTE — Care Management (Signed)
Sent request via secure inbox to [employer] Pool to check benefit and price for Elequis Attempted to get the price from [facility] in [place] and was unable to do so without RX in hand.

## 2018-09-27 NOTE — Care Management (Signed)
2/28 obtained Eliquis 10$ copay card to give to the patient

## 2018-09-27 NOTE — Consult Note (Signed)
ANTICOAGULATION CONSULT NOTE - Follow up Consult  Pharmacy Consult for Heparin Infusion  Indication: pulmonary embolus  No Known Allergies  Patient Measurements: Height: 5\' 7"  (170.2 cm) Weight: 150 lb (68 kg) IBW/kg (Calculated) : 61.6  Heparin weight 68 kg  Vital Signs: Temp: 98.4 F (36.9 C) (02/28 1214) Temp Source: Oral (02/28 1214) BP: 136/80 (02/28 1214) Pulse Rate: 63 (02/28 1214)  Labs: Recent Labs    09/26/18 1823 09/26/18 1906 09/27/18 0120 09/27/18 0730 09/27/18 1944  HGB 12.8  --  12.2  --   --   HCT 38.5  --  36.5  --   --   PLT 316  --  306  --   --   APTT  --  28  --   --   --   LABPROT 11.6  --   --   --   --   INR 0.9  --   --   --   --   HEPARINUNFRC  --   --  0.56 0.68 0.46  CREATININE 0.77  --  0.70  --   --   TROPONINI <0.03  --   --   --   --     Estimated Creatinine Clearance: 110 mL/min (by C-G formula based on SCr of 0.7 mg/dL).  Medical History: Past Medical History:  Diagnosis Date  . Cellulitis of face     Assessment: Pharmacy has been consulted for heparin drip dosing and monitoring in 18 yo female admitted w/ PE.   Heparin Course: 2/27  4000 units bolus x 1, heparin infusion at 1150 units/hr 02/28 @ 0120 HL 0.56 therapeutic. 02/28 @ 0730 HL 0.68   Goal of Therapy:  Heparin level 0.3-0.7 units/ml Monitor platelets by anticoagulation protocol: Yes   Plan:  02/28 @ 1944 HL 0.46 -  therapeutic x3   Will continue current rate and will recheck HL and CBC in AM.   Pharmacy will continue to follow.  Albina Billet, PharmD, BCPS Clinical Pharmacist 09/27/2018 8:24 PM

## 2018-09-27 NOTE — Progress Notes (Addendum)
[employer] [facility] [place] [facility]   PATIENT NAME: Melinda Robles    MR#:  [id number]  DATE OF BIRTH:  [birth year]  SUBJECTIVE:  CHIEF COMPLAINT:   Chief Complaint  Patient presents with  . Chest Pain   No new complaint this morning.  No further hemoptysis overnight.  Patient diagnosed with acute pulmonary embolism with hemoptysis on admission.  Currently on heparin drip. Feeling much better.  No chest pain or shortness of breath this morning.  REVIEW OF SYSTEMS:  Review of Systems  Constitutional: Negative for chills, fever and weight loss.  HENT: Negative for ear pain, hearing loss and tinnitus.   Eyes: Negative for blurred vision and double vision.  Respiratory: Negative for cough and shortness of breath.        Hemoptysis appear resolved  Cardiovascular: Negative for palpitations.       Chest pain resolved.  Gastrointestinal: Negative for heartburn, nausea and vomiting.  Genitourinary: Negative for dysuria, frequency and urgency.  Musculoskeletal: Negative for myalgias and neck pain.  Neurological: Negative for dizziness and headaches.  Psychiatric/Behavioral: Negative for depression and suicidal ideas.      DRUG ALLERGIES:  No Known Allergies VITALS:  Blood pressure 136/80, pulse 63, temperature 98.4 F (36.9 C), temperature source Oral, resp. rate 18, height  (1.702 m), weight 68 kg, last menstrual period 08/30/2018, SpO2 100 %. PHYSICAL EXAMINATION:   Physical Exam  Constitutional: She is oriented to person, place, and time. She appears well-developed and well-nourished.  HENT:  Head: Normocephalic and atraumatic.  Right Ear: External ear normal.  Mouth/Throat: Oropharynx is clear and moist.  Eyes: Pupils are equal, round, and reactive to light. Conjunctivae are normal. Right eye exhibits no discharge.  Neck: Normal range of motion. No tracheal deviation present.  Cardiovascular: Normal rate, regular rhythm and normal heart sounds.    Respiratory: Effort normal and breath sounds normal. No respiratory distress.  GI: Soft. Bowel sounds are normal. There is no abdominal tenderness.  Musculoskeletal: Normal range of motion.        General: No edema.  Neurological: She is alert and oriented to person, place, and time.  Skin: Skin is warm. She is not diaphoretic. No erythema.  Psychiatric: She has a normal mood and affect. Thought content normal.   LABORATORY PANEL:  Female CBC Recent Labs  Lab 09/27/18 0120  WBC 7.6  HGB 12.2  HCT 36.5  PLT 306   ------------------------------------------------------------------------------------------------------------------ Chemistries  Recent Labs  Lab 09/27/18 0120  NA 140  K 3.4*  CL 105  CO2 22  GLUCOSE 122*  BUN 14  CREATININE 0.70  CALCIUM 8.8*   RADIOLOGY:  Ct Angio Chest Pe W Or Wo Contrast  Result Date: 09/26/2018 CLINICAL DATA:  Hemoptysis x 3 days, SOB, recent staph. Infection to face, Pt is on Birth control EXAM: CT ANGIOGRAPHY CHEST WITH CONTRAST TECHNIQUE: Multidetector CT imaging of the chest was performed using the standard protocol during bolus administration of intravenous contrast. Multiplanar CT image reconstructions and MIPs were obtained to evaluate the vascular anatomy. CONTRAST:  75mL OMNIPAQUE IOHEXOL 350 MG/ML SOLN COMPARISON:  Chest radiographs, 09/25/2018 FINDINGS: Cardiovascular: There is satisfactory opacification of the pulmonary arteries to the segmental level. There are several small pulmonary emboli. Pulmonary emboli are noted at the branch point of the right lower lobe pulmonary artery with a discrete pulmonary embolus noted in the lateral segmental branch to the right lower lobe. On the left, there is a pulmonary embolus to a lingular  branch segmental pulmonary artery. No other definite pulmonary emboli. Heart is normal in size and configuration. No pericardial effusion. Great vessels are within normal limits. Mediastinum/Nodes: No enlarged  mediastinal, hilar, or axillary lymph nodes. Thyroid gland, trachea, and esophagus demonstrate no significant findings. Lungs/Pleura: Small right pleural effusion. There is a small area of airspace opacity at the lateral right lung base consistent with atelectasis or possibly infarction. There is a small patchy area of opacity in the posterolateral left lung base consistent with atelectasis. Lungs otherwise clear. No left pleural effusion. No pneumothorax. Upper Abdomen: Unremarkable. Musculoskeletal: No chest wall abnormality. No acute or significant osseous findings. Review of the MIP images confirms the above findings. IMPRESSION: 1. Small segmental pulmonary emboli, right lower lobe and left upper lobe lingula, as detailed. 2. Mild lateral right lower lobe airspace opacity the small area posterolateral left lung base opacity. This is most likely atelectasis. Infarction on the right is possible. 3. Small right pleural effusion. Electronically Signed   By: Amie Portland M.D.   On: 09/26/2018 17:50   US Venous Img Lower Bilateral  Result Date: 09/26/2018 CLINICAL DATA:  Pulmonary emboli EXAM: BILATERAL LOWER EXTREMITY VENOUS DOPPLER ULTRASOUND TECHNIQUE: Gray-scale sonography with graded compression, as well as color Doppler and duplex ultrasound were performed to evaluate the lower extremity deep venous systems from the level of the common femoral vein and including the common femoral, femoral, profunda femoral, popliteal and calf veins including the posterior tibial, peroneal and gastrocnemius veins when visible. The superficial great saphenous vein was also interrogated. Spectral Doppler was utilized to evaluate flow at rest and with distal augmentation maneuvers in the common femoral, femoral and popliteal veins. COMPARISON:  None. FINDINGS: RIGHT LOWER EXTREMITY Common Femoral Vein: No evidence of thrombus. Normal compressibility, respiratory phasicity and response to augmentation. Saphenofemoral Junction:  No evidence of thrombus. Normal compressibility and flow on color Doppler imaging. Profunda Femoral Vein: No evidence of thrombus. Normal compressibility and flow on color Doppler imaging. Femoral Vein: No evidence of thrombus. Normal compressibility, respiratory phasicity and response to augmentation. Popliteal Vein: No evidence of thrombus. Normal compressibility, respiratory phasicity and response to augmentation. Calf Veins: No evidence of thrombus. Normal compressibility and flow on color Doppler imaging. Superficial Great Saphenous Vein: No evidence of thrombus. Normal compressibility. Venous Reflux:  None. Other Findings:  None. LEFT LOWER EXTREMITY Common Femoral Vein: No evidence of thrombus. Normal compressibility, respiratory phasicity and response to augmentation. Saphenofemoral Junction: No evidence of thrombus. Normal compressibility and flow on color Doppler imaging. Profunda Femoral Vein: No evidence of thrombus. Normal compressibility and flow on color Doppler imaging. Femoral Vein: No evidence of thrombus. Normal compressibility, respiratory phasicity and response to augmentation. Popliteal Vein: No evidence of thrombus. Normal compressibility, respiratory phasicity and response to augmentation. Calf Veins: No evidence of thrombus. Normal compressibility and flow on color Doppler imaging. Superficial Great Saphenous Vein: No evidence of thrombus. Normal compressibility. Venous Reflux:  None. Other Findings:  None. IMPRESSION: No evidence of deep venous thrombosis. Electronically Signed   By: Charlett Nose M.D.   On: 09/26/2018 21:49   US Venous Img Upper Bilat  Result Date: 09/27/2018 CLINICAL DATA:  History of pulmonary embolism, now with right shoulder swelling and chest pain. Evaluate for DVT. EXAM: BILATERAL UPPER EXTREMITY VENOUS DOPPLER ULTRASOUND TECHNIQUE: Gray-scale sonography with graded compression, as well as color Doppler and duplex ultrasound were performed to evaluate the  bilateral upper extremity deep venous systems from the level of the subclavian vein and including the jugular,  axillary, basilic, radial, ulnar and upper cephalic vein. Spectral Doppler was utilized to evaluate flow at rest and with distal augmentation maneuvers. COMPARISON:  None. FINDINGS: RIGHT UPPER EXTREMITY Internal Jugular Vein: No evidence of thrombus. Normal compressibility, respiratory phasicity and response to augmentation. Subclavian Vein: No evidence of thrombus. Normal compressibility, respiratory phasicity and response to augmentation. Axillary Vein: No evidence of thrombus. Normal compressibility, respiratory phasicity and response to augmentation. Cephalic Vein: No evidence of thrombus. Normal compressibility, respiratory phasicity and response to augmentation. Basilic Vein: No evidence of thrombus. Normal compressibility, respiratory phasicity and response to augmentation. Brachial Veins: No evidence of thrombus. Normal compressibility, respiratory phasicity and response to augmentation. Radial Veins: No evidence of thrombus. Normal compressibility, respiratory phasicity and response to augmentation. Ulnar Veins: No evidence of thrombus. Normal compressibility, respiratory phasicity and response to augmentation. Venous Reflux:  None. Other Findings:  None. LEFT UPPER EXTREMITY Internal Jugular Vein: No evidence of thrombus. Normal compressibility, respiratory phasicity and response to augmentation. Subclavian Vein: No evidence of thrombus. Normal compressibility, respiratory phasicity and response to augmentation. Axillary Vein: No evidence of thrombus. Normal compressibility, respiratory phasicity and response to augmentation. Cephalic Vein: No evidence of thrombus. Normal compressibility, respiratory phasicity and response to augmentation. Basilic Vein: No evidence of thrombus. Normal compressibility, respiratory phasicity and response to augmentation. Brachial Veins: No evidence of thrombus.  Normal compressibility, respiratory phasicity and response to augmentation. Radial Veins: No evidence of thrombus. Normal compressibility, respiratory phasicity and response to augmentation. Ulnar Veins: No evidence of thrombus. Normal compressibility, respiratory phasicity and response to augmentation. Venous Reflux:  None. Other Findings: There is hypoechoic near occlusive short-segment thrombus within a branch of the left cephalic vein which reportedly contains a peripheral IV (images 51 through 56). IMPRESSION: 1. No evidence of DVT within either upper extremity. 2. Near occlusive short-segment superficial thrombophlebitis involving a branch of the cephalic vein which reportedly contains a peripheral IV. There is no extension of this short-segment SVT to the deep venous system of the left upper extremity. Electronically Signed   By: Simonne ComeJohn  Watts M.D.   On: 09/27/2018 10:25   ASSESSMENT AND PLAN:   Patient is 18 year old with no significant medical history presented with shortness of breath and hemoptysis.  Diagnosed with pulmonary embolism.  No more hemoptysis reported.  Currently on heparin drip.  1.  Pulmonary embolism Patient presented with complaints of shortness of breath with some hemoptysis.  Patient has been on birth control pills.  Also has family history positive for protein S deficiency.  Hypercoagulability work-up was requested in the emergency room.  Patient was started on heparin drip.  Admitted to medical service.  Patient seen by pulmonary service this morning.  I discussed case with Dr. Belia HemanKasa who recommended hematology consultation to see patient prior to discharge.  Dr. Janell QuietGovinda Brohimanday to see patient Plan is to transition from heparin drip to Eliquis prior to discharge from the hospital.  I was later notified by case manager that Eliquis is covered 100% by patient's insurance.   Oxygen saturation of 100% on room air this morning.  2.  Hemoptysis due to pulmonary infarct    Appear resolved.  Hemoglobin fairly stable at 12.2 this morning.  Disposition; anticipate transitioning from heparin drip to Eliquis in a.m. after patient is evaluated by hematologist  DVT prophylaxis; patient on heparin drip already Updated multiple family members including mother at bedside  All the records are reviewed and case discussed with Care Management/Social Worker. Management plans discussed with the patient, family and  they are in agreement.  CODE STATUS: Full Code  TOTAL TIME TAKING CARE OF THIS PATIENT: 25 minutes.   More than 50% of the time was spent in counseling/coordination of care: YES  POSSIBLE D/C IN 1 DAY, DEPENDING ON CLINICAL CONDITION.   Captain Blucher M.D on 09/27/2018 at 1:56 PM  Between 7am to 6pm - Pager - [phone]  After 6pm go to [email or web address] - Social research officer, government  [facility]  Office  [phone]  CC: Primary care physician; Jolene Provost, MD  Note: This dictation was prepared with Dragon dictation along with smaller phrase technology. Any transcriptional errors that result from this process are unintentional.

## 2018-09-27 NOTE — Progress Notes (Signed)
Patient presents today with symptoms of right upper trap pain.  Patient states that she has been getting the cupping procedure done by the training staff.  She admits the pain feels muscular and the cupping has helped some.  She denies any neck pain or any symptoms into her upper extremities. Patient also states that she has coughed up blood 4-5 times over the last 2 days.  She denies having fever, chills, nasal congestion, sore throat, epistaxis, headache, weight loss, chest pain.  She has noticed some pain in her right upper trap area when she takes deep breath.  She denies any history of asthma, PE, DVT.  She does admit to family history of blood clots.  Patient is on oral birth control. She denies any swelling or pain of any extremities. She denies any history of smoking or vaping.  She denies any international travel recently.  Denies taking any new medications or supplements recently.  ROS: Negative except mentioned above. Vitals as per Epic. GENERAL: NAD HEENT: no pharyngeal erythema, no exudate, no erythema of TMs, no cervical LAD RESP: CTA B, no wheezing or rhonchi appreciated, no accessory muscle use noted CARD: RRR MSK: No midline tenderness of the back, positive ecchymosis from cupping procedure around right upper trap area, mild tenderness to palpation over this area, full range of motion of the neck and upper extremities, negative Spurling's NEURO: CN II-XII grossly intact   A/P: Hemoptysis -patient did have a chest x-ray done which was as normal, will have patient see pulmonary today, will likely need CT scan of the chest and labs drawn, patient was made an appointment with [facility] in [place] for this afternoon, if patient has any acute worsening symptoms she is to go to the ER.  Patient addresses understanding and is appreciative.  Right upper trap. pain -appears to be muscular in nature, unsure whether related to current symptoms, will discuss further work-up and  treatment plan after etiology of hemoptysis is found and treatment is started.  Above was discussed with athletic trainer.

## 2018-09-27 NOTE — Consult Note (Signed)
ANTICOAGULATION CONSULT NOTE - Initial Consult  Pharmacy Consult for Heparin Infusion  Indication: pulmonary embolus  No Known Allergies  Patient Measurements: Height: 5\' 7"  (170.2 cm) Weight: 150 lb (68 kg) IBW/kg (Calculated) : 61.6  Vital Signs: Temp: 97.9 F (36.6 C) (02/27 2200) Temp Source: Oral (02/27 2200) BP: 124/79 (02/27 2200) Pulse Rate: 50 (02/27 2200)  Labs: Recent Labs    09/26/18 1823 09/26/18 1906 09/27/18 0120  HGB 12.8  --  12.2  HCT 38.5  --  36.5  PLT 316  --  306  APTT  --  28  --   LABPROT 11.6  --   --   INR 0.9  --   --   HEPARINUNFRC  --   --  0.56  CREATININE 0.77  --  0.70  TROPONINI <0.03  --   --     Estimated Creatinine Clearance: 110 mL/min (by C-G formula based on SCr of 0.7 mg/dL).  Medical History: Past Medical History:  Diagnosis Date  . Cellulitis of face     Assessment: Pharmacy has been consulted for heparin drip dosing and monitoring in 18 yo female admitted w/ PE.   Goal of Therapy:  Heparin level 0.3-0.7 units/ml Monitor platelets by anticoagulation protocol: Yes   Plan:  02/28 @ 0120 HL 0.56 therapeutic. Will continue current rate and will recheck HL @ 0730, baseline labs WNL, CBC stable will continue to monitor.  Thomasene Ripple, PharmD, BCPS Clinical Pharmacist 09/27/2018 3:00 AM

## 2018-09-27 NOTE — Care Management (Signed)
Called [facility] at [phone] and requested to run to see if the Eliquis will be covered on her insurance, they ran a dummy order and it is covered at 100% on her insurance, notified Physician and Patient

## 2018-09-27 NOTE — Consult Note (Signed)
ANTICOAGULATION CONSULT NOTE - Follow up Consult  Pharmacy Consult for Heparin Infusion  Indication: pulmonary embolus  No Known Allergies  Patient Measurements: Height: 5\' 7"  (170.2 cm) Weight: 150 lb (68 kg) IBW/kg (Calculated) : 61.6  Heparin weight 68 kg  Vital Signs: Temp: 97.9 F (36.6 C) (02/27 2200) Temp Source: Oral (02/27 2200) BP: 124/79 (02/27 2200) Pulse Rate: 50 (02/27 2200)  Labs: Recent Labs    09/26/18 1823 09/26/18 1906 09/27/18 0120 09/27/18 0730  HGB 12.8  --  12.2  --   HCT 38.5  --  36.5  --   PLT 316  --  306  --   APTT  --  28  --   --   LABPROT 11.6  --   --   --   INR 0.9  --   --   --   HEPARINUNFRC  --   --  0.56 0.68  CREATININE 0.77  --  0.70  --   TROPONINI <0.03  --   --   --     Estimated Creatinine Clearance: 110 mL/min (by C-G formula based on SCr of 0.7 mg/dL).  Medical History: Past Medical History:  Diagnosis Date  . Cellulitis of face     Assessment: Pharmacy has been consulted for heparin drip dosing and monitoring in 18 yo female admitted w/ PE.   Heparin Course: 2/27  4000 units bolus x 1, heparin infusion at 1150 units/hr 02/28 @ 0120 HL 0.56 therapeutic.  Goal of Therapy:  Heparin level 0.3-0.7 units/ml Monitor platelets by anticoagulation protocol: Yes   Plan:  02/28 @ 0730 HL 0.68 therapeutic x2. Will continue current rate and will recheck HL @ 2000 due to concerns for hemoptysis and HL near upper limit of therapeutic. CBC stable will continue to monitor. Recheck CBC in AM.   Pharmacy will continue to follow.  Marty Heck, PharmD, BCPS Clinical Pharmacist 09/27/2018 8:05 AM

## 2018-09-28 ENCOUNTER — Telehealth: Payer: Self-pay | Admitting: Internal Medicine

## 2018-09-28 DIAGNOSIS — I2699 Other pulmonary embolism without acute cor pulmonale: Secondary | ICD-10-CM

## 2018-09-28 LAB — BASIC METABOLIC PANEL WITH GFR
Anion gap: 8 (ref 5–15)
BUN: 14 mg/dL (ref 6–20)
CO2: 24 mmol/L (ref 22–32)
Calcium: 9 mg/dL (ref 8.9–10.3)
Chloride: 107 mmol/L (ref 98–111)
Creatinine, Ser: 0.81 mg/dL (ref 0.44–1.00)
GFR calc Af Amer: >60 mL/min
GFR calc non Af Amer: >60 mL/min
Glucose, Bld: 92 mg/dL (ref 70–99)
Potassium: 3.9 mmol/L (ref 3.5–5.1)
Sodium: 139 mmol/L (ref 135–145)

## 2018-09-28 LAB — CBC
HCT: 41 % (ref 36.0–46.0)
Hemoglobin: 13.3 g/dL (ref 12.0–15.0)
MCH: 30 pg (ref 26.0–34.0)
MCHC: 32.4 g/dL (ref 30.0–36.0)
MCV: 92.3 fL (ref 80.0–100.0)
Platelets: 307 10*3/uL (ref 150–400)
RBC: 4.44 MIL/uL (ref 3.87–5.11)
RDW: 13.5 % (ref 11.5–15.5)
WBC: 6.6 10*3/uL (ref 4.0–10.5)
nRBC: 0 % (ref 0.0–0.2)

## 2018-09-28 LAB — PROTEIN S PANEL
Protein S Activity: 48 % — ABNORMAL LOW (ref 63–140)
Protein S Ag, Free: 166 % — ABNORMAL HIGH (ref 57–157)
Protein S Ag, Total: 95 % (ref 60–150)

## 2018-09-28 LAB — PROTEIN C, TOTAL: Protein C, Total: 124 % (ref 60–150)

## 2018-09-28 LAB — HEPARIN LEVEL (UNFRACTIONATED): Heparin Unfractionated: 0.54 [IU]/mL (ref 0.30–0.70)

## 2018-09-28 LAB — ANTITHROMBIN III: AntiThromb III Func: 88 % (ref 75–120)

## 2018-09-28 LAB — MAGNESIUM: MAGNESIUM: 2.3 mg/dL (ref 1.7–2.4)

## 2018-09-28 MED ORDER — APIXABAN 5 MG PO TABS: 5.0000 mg | ORAL_TABLET | Freq: Two times a day (BID) | ORAL | Status: DC

## 2018-09-28 MED ORDER — APIXABAN 5 MG PO TABS
10.0000 mg | ORAL_TABLET | Freq: Two times a day (BID) | ORAL | Status: DC
  Administered 2018-09-28: 10 mg via ORAL
  Filled 2018-09-28: qty 2

## 2018-09-28 MED ORDER — APIXABAN 5 MG PO TABS: 5.0000 mg | ORAL_TABLET | Freq: Two times a day (BID) | ORAL | 2 refills | Status: AC

## 2018-09-28 MED ORDER — APIXABAN 5 MG PO TABS: 10.0000 mg | ORAL_TABLET | Freq: Two times a day (BID) | ORAL | 0 refills | Status: DC

## 2018-09-28 NOTE — Assessment & Plan Note (Signed)
#  18 year old female patient currently in the hospital for worsening shortness of breath/chest pain noted to have bilateral PE  #Bilateral pulmonary embolism segmental arteries-without any hemodynamic instability.  Clinically stable/room air oxygen.  Question provoked BCP/family history of protein S deficiency.  Agree with transitioning from IV heparin to oral Eliquis.   #Hemoptysis likely secondary to pulmonary infarction-currently resolved.  Recommendation:   #Await hypercoagulable work-up.  Recommend in general anticoagulation around 6 to 12 months.  However, patient recommendation for final length of anticoagulation will be based upon results of above work-up.   #Had a long discussion regarding avoiding contact sport/and physical activity that could easily cause trauma/falls.  Discussed bleeding precautions detail.  #Patient will follow-up with me in approximately 2 weeks in the clinic/review the results of the hypercoagulable work-up.   Thank you Dr.Shah for allowing me to participate in the care of your pleasant patient. Please do not hesitate to contact me with questions or concerns in the interim.  Discussed with Dr. Clelia Croft.   # 60 minutes face-to-face with the patient discussing the above plan of care; more than 50% of time spent on prognosis/ natural history; counseling and coordination.

## 2018-09-28 NOTE — Progress Notes (Signed)
Discharge instructions given to patient and mother with verbalization of understanding. Dr. Sherryll Burger and the PA came back down to speak with the patient and her parents about her cough and what she produced when coughing. Patient is okay for discharge.

## 2018-09-28 NOTE — Consult Note (Signed)
[facility] CONSULT NOTE  Patient Care Team: Jolene Provost, MD as PCP - General (Family Medicine)  CHIEF COMPLAINTS/PURPOSE OF CONSULTATION:  Pulmonary embolism  HISTORY OF PRESENTING ILLNESS:  Melinda Robles 18 y.o.  female with no personal history of blood clots; with a family history of blood clots is currently admitted to hospital for bilateral PE.  Hematology has been consulted for possible primary Hypercoagulable/recommendations.  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   Over the last few days patient noted to have worsening shortness of breath on exertion.  She also noted to have intermittent hemoptysis for last few days.  Patient was evaluated by PCP then referred for further evaluation for PE.  CT scan showed bilateral segmental pulmonary emboli.  No saddle emboli or right heart strain.  Patient was started on IV heparin on the day of admission.  Patient has been transitioned to Eliquis this morning.  Of note patient was recently admitted to hospital for facial cellulitis.  IV antibiotics.  No significant immobilization.  With regards risk factors: Long distance travel-no recent travel; travel to [place] approximately 2 months ago. Immobilization/trauma: None Previous history of DVT/PE: None Family history: Mother has protein S deficiency/no blood clots; maternal uncle protein S deficiency/PE on anticoagulation.;  Maternal grandmother antiphospholipid antibody syndrome-on Coumadin Birth control pills: Yes; currently stopped during this admission  Patient feels overall improved since admission.  Denies any more hemoptysis.  No chest pain.  Review of Systems  Constitutional: Negative for chills, diaphoresis, fever, malaise/fatigue and weight loss.  HENT: Negative for nosebleeds and sore throat.   Eyes: Negative for double vision.  Respiratory: Negative for cough, hemoptysis, sputum production, shortness of breath and wheezing.    Cardiovascular: Negative for chest pain, palpitations, orthopnea and leg swelling.  Gastrointestinal: Negative for abdominal pain, blood in stool, constipation, diarrhea, heartburn, melena, nausea and vomiting.  Genitourinary: Negative for dysuria, frequency and urgency.  Musculoskeletal: Negative for back pain and joint pain.  Skin: Negative.  Negative for itching and rash.  Neurological: Negative for dizziness, tingling, focal weakness, weakness and headaches.  Endo/Heme/Allergies: Does not bruise/bleed easily.  Psychiatric/Behavioral: Negative for depression. The patient is not nervous/anxious and does not have insomnia.      MEDICAL HISTORY:  Past Medical History:  Diagnosis Date  . Cellulitis of face     SURGICAL HISTORY: Past Surgical History:  Procedure Laterality Date  . KNEE SURGERY Right 05/2015    SOCIAL HISTORY: Social History   Socioeconomic History  . Marital status: Single    Spouse name: Not on file  . Number of children: Not on file  . Years of education: Not on file  . Highest education level: Not on file  Occupational History  . Not on file  Social Needs  . Financial resource strain: Not on file  . Food insecurity:    Worry: Not on file    Inability: Not on file  . Transportation needs:    Medical: Not on file    Non-medical: Not on file  Tobacco Use  . Smoking status: Never Smoker  . Smokeless tobacco: Never Used  Substance and Sexual Activity  . Alcohol use: Yes  . Drug use: Not on file  . Sexual activity: Not on file  Lifestyle  . Physical activity:    Days per week: Not on file    Minutes per session: Not on file  . Stress: Not on file  Relationships  . Social connections:  Talks on phone: Not on file    Gets together: Not on file    Attends religious service: Not on file    Active member of club or organization: Not on file    Attends meetings of clubs or organizations: Not on file    Relationship status: Not on file  . Intimate  partner violence:    Fear of current or ex partner: Not on file    Emotionally abused: Not on file    Physically abused: Not on file    Forced sexual activity: Not on file  Other Topics Concern  . Not on file  Social History Narrative  . Not on file    FAMILY HISTORY: Family History  Problem Relation Age of Onset  . Protein S deficiency Mother     ALLERGIES:  has No Known Allergies.  MEDICATIONS:  No current facility-administered medications for this encounter.    Current Outpatient Medications  Medication Sig Dispense Refill  . vitamin B-12 (CYANOCOBALAMIN) 1000 MCG tablet Take 1,000 mcg by mouth daily.    Marland Kitchen apixaban (ELIQUIS) 5 MG TABS tablet Take 2 tablets (10 mg total) by mouth 2 (two) times daily for 7 days. 28 tablet 0  . [START ON 10/05/2018] apixaban (ELIQUIS) 5 MG TABS tablet Take 1 tablet (5 mg total) by mouth 2 (two) times daily. 60 tablet 2      .  PHYSICAL EXAMINATION:  Vitals:   09/28/18 0427 09/28/18 0816  BP: 128/78 120/73  Pulse: 67 (!) 54  Resp: 18 19  Temp: 98.6 F (37 C) (!) 97.5 F (36.4 C)  SpO2: 100% 100%   Filed Weights   09/26/18 1817 09/26/18 2157  Weight: 150 lb (68 kg) 150 lb (68 kg)    Physical Exam  Constitutional: She is oriented to person, place, and time and well-developed, well-nourished, and in no distress.  Accompanied by her mother/father.  HENT:  Head: Normocephalic and atraumatic.  Mouth/Throat: Oropharynx is clear and moist. No oropharyngeal exudate.  Eyes: Pupils are equal, round, and reactive to light.  Neck: Normal range of motion. Neck supple.  Cardiovascular: Normal rate and regular rhythm.  Pulmonary/Chest: Effort normal and breath sounds normal. No respiratory distress. She has no wheezes.  Abdominal: Soft. Bowel sounds are normal. She exhibits no distension and no mass. There is no abdominal tenderness. There is no rebound and no guarding.  Musculoskeletal: Normal range of motion.        General: No tenderness  or edema.  Neurological: She is alert and oriented to person, place, and time.  Skin: Skin is warm.  Psychiatric: Affect normal.     LABORATORY DATA:  I have reviewed the data as listed Lab Results  Component Value Date   WBC 6.6 09/28/2018   HGB 13.3 09/28/2018   HCT 41.0 09/28/2018   MCV 92.3 09/28/2018   PLT 307 09/28/2018   Recent Labs    09/06/18 0232 09/26/18 1823 09/27/18 0120 09/28/18 0639  NA 138 138 140 139  K 3.5 3.6 3.4* 3.9  CL 102 104 105 107  CO2 26 25 22 24   GLUCOSE 92 90 122* 92  BUN 16 15 14 14   CREATININE 0.94 0.77 0.70 0.81  CALCIUM 9.2 8.9 8.8* 9.0  GFRNONAA >60 >60 >60 >60  GFRAA >60 >60 >60 >60  PROT 7.4  --   --   --   ALBUMIN 4.6  --   --   --   AST 24  --   --   --  ALT 24  --   --   --   ALKPHOS 52  --   --   --   BILITOT 0.4  --   --   --     RADIOGRAPHIC STUDIES: I have personally reviewed the radiological images as listed and agreed with the findings in the report. Dg Chest 2 View  Result Date: 09/25/2018 CLINICAL DATA:  Cough and difficulty breathing EXAM: CHEST - 2 VIEW COMPARISON:  None. FINDINGS: Lungs are clear. The heart size and pulmonary vascularity are normal. No adenopathy. There is mild upper thoracic levoscoliosis. IMPRESSION: No edema or consolidation. Electronically Signed   By: Bretta Bang III M.D.   On: 09/25/2018 14:42   Ct Angio Chest Pe W Or Wo Contrast  Result Date: 09/26/2018 CLINICAL DATA:  Hemoptysis x 3 days, SOB, recent staph. Infection to face, Pt is on Birth control EXAM: CT ANGIOGRAPHY CHEST WITH CONTRAST TECHNIQUE: Multidetector CT imaging of the chest was performed using the standard protocol during bolus administration of intravenous contrast. Multiplanar CT image reconstructions and MIPs were obtained to evaluate the vascular anatomy. CONTRAST:  45mL OMNIPAQUE IOHEXOL 350 MG/ML SOLN COMPARISON:  Chest radiographs, 09/25/2018 FINDINGS: Cardiovascular: There is satisfactory opacification of the  pulmonary arteries to the segmental level. There are several small pulmonary emboli. Pulmonary emboli are noted at the branch point of the right lower lobe pulmonary artery with a discrete pulmonary embolus noted in the lateral segmental branch to the right lower lobe. On the left, there is a pulmonary embolus to a lingular branch segmental pulmonary artery. No other definite pulmonary emboli. Heart is normal in size and configuration. No pericardial effusion. Great vessels are within normal limits. Mediastinum/Nodes: No enlarged mediastinal, hilar, or axillary lymph nodes. Thyroid gland, trachea, and esophagus demonstrate no significant findings. Lungs/Pleura: Small right pleural effusion. There is a small area of airspace opacity at the lateral right lung base consistent with atelectasis or possibly infarction. There is a small patchy area of opacity in the posterolateral left lung base consistent with atelectasis. Lungs otherwise clear. No left pleural effusion. No pneumothorax. Upper Abdomen: Unremarkable. Musculoskeletal: No chest wall abnormality. No acute or significant osseous findings. Review of the MIP images confirms the above findings. IMPRESSION: 1. Small segmental pulmonary emboli, right lower lobe and left upper lobe lingula, as detailed. 2. Mild lateral right lower lobe airspace opacity the small area posterolateral left lung base opacity. This is most likely atelectasis. Infarction on the right is possible. 3. Small right pleural effusion. Electronically Signed   By: Amie Portland M.D.   On: 09/26/2018 17:50   US Venous Img Lower Bilateral  Result Date: 09/26/2018 CLINICAL DATA:  Pulmonary emboli EXAM: BILATERAL LOWER EXTREMITY VENOUS DOPPLER ULTRASOUND TECHNIQUE: Gray-scale sonography with graded compression, as well as color Doppler and duplex ultrasound were performed to evaluate the lower extremity deep venous systems from the level of the common femoral vein and including the common  femoral, femoral, profunda femoral, popliteal and calf veins including the posterior tibial, peroneal and gastrocnemius veins when visible. The superficial great saphenous vein was also interrogated. Spectral Doppler was utilized to evaluate flow at rest and with distal augmentation maneuvers in the common femoral, femoral and popliteal veins. COMPARISON:  None. FINDINGS: RIGHT LOWER EXTREMITY Common Femoral Vein: No evidence of thrombus. Normal compressibility, respiratory phasicity and response to augmentation. Saphenofemoral Junction: No evidence of thrombus. Normal compressibility and flow on color Doppler imaging. Profunda Femoral Vein: No evidence of thrombus. Normal compressibility and flow  on color Doppler imaging. Femoral Vein: No evidence of thrombus. Normal compressibility, respiratory phasicity and response to augmentation. Popliteal Vein: No evidence of thrombus. Normal compressibility, respiratory phasicity and response to augmentation. Calf Veins: No evidence of thrombus. Normal compressibility and flow on color Doppler imaging. Superficial Great Saphenous Vein: No evidence of thrombus. Normal compressibility. Venous Reflux:  None. Other Findings:  None. LEFT LOWER EXTREMITY Common Femoral Vein: No evidence of thrombus. Normal compressibility, respiratory phasicity and response to augmentation. Saphenofemoral Junction: No evidence of thrombus. Normal compressibility and flow on color Doppler imaging. Profunda Femoral Vein: No evidence of thrombus. Normal compressibility and flow on color Doppler imaging. Femoral Vein: No evidence of thrombus. Normal compressibility, respiratory phasicity and response to augmentation. Popliteal Vein: No evidence of thrombus. Normal compressibility, respiratory phasicity and response to augmentation. Calf Veins: No evidence of thrombus. Normal compressibility and flow on color Doppler imaging. Superficial Great Saphenous Vein: No evidence of thrombus. Normal  compressibility. Venous Reflux:  None. Other Findings:  None. IMPRESSION: No evidence of deep venous thrombosis. Electronically Signed   By: Charlett Nose M.D.   On: 09/26/2018 21:49   US Venous Img Upper Bilat  Result Date: 09/27/2018 CLINICAL DATA:  History of pulmonary embolism, now with right shoulder swelling and chest pain. Evaluate for DVT. EXAM: BILATERAL UPPER EXTREMITY VENOUS DOPPLER ULTRASOUND TECHNIQUE: Gray-scale sonography with graded compression, as well as color Doppler and duplex ultrasound were performed to evaluate the bilateral upper extremity deep venous systems from the level of the subclavian vein and including the jugular, axillary, basilic, radial, ulnar and upper cephalic vein. Spectral Doppler was utilized to evaluate flow at rest and with distal augmentation maneuvers. COMPARISON:  None. FINDINGS: RIGHT UPPER EXTREMITY Internal Jugular Vein: No evidence of thrombus. Normal compressibility, respiratory phasicity and response to augmentation. Subclavian Vein: No evidence of thrombus. Normal compressibility, respiratory phasicity and response to augmentation. Axillary Vein: No evidence of thrombus. Normal compressibility, respiratory phasicity and response to augmentation. Cephalic Vein: No evidence of thrombus. Normal compressibility, respiratory phasicity and response to augmentation. Basilic Vein: No evidence of thrombus. Normal compressibility, respiratory phasicity and response to augmentation. Brachial Veins: No evidence of thrombus. Normal compressibility, respiratory phasicity and response to augmentation. Radial Veins: No evidence of thrombus. Normal compressibility, respiratory phasicity and response to augmentation. Ulnar Veins: No evidence of thrombus. Normal compressibility, respiratory phasicity and response to augmentation. Venous Reflux:  None. Other Findings:  None. LEFT UPPER EXTREMITY Internal Jugular Vein: No evidence of thrombus. Normal compressibility, respiratory  phasicity and response to augmentation. Subclavian Vein: No evidence of thrombus. Normal compressibility, respiratory phasicity and response to augmentation. Axillary Vein: No evidence of thrombus. Normal compressibility, respiratory phasicity and response to augmentation. Cephalic Vein: No evidence of thrombus. Normal compressibility, respiratory phasicity and response to augmentation. Basilic Vein: No evidence of thrombus. Normal compressibility, respiratory phasicity and response to augmentation. Brachial Veins: No evidence of thrombus. Normal compressibility, respiratory phasicity and response to augmentation. Radial Veins: No evidence of thrombus. Normal compressibility, respiratory phasicity and response to augmentation. Ulnar Veins: No evidence of thrombus. Normal compressibility, respiratory phasicity and response to augmentation. Venous Reflux:  None. Other Findings: There is hypoechoic near occlusive short-segment thrombus within a branch of the left cephalic vein which reportedly contains a peripheral IV (images 51 through 56). IMPRESSION: 1. No evidence of DVT within either upper extremity. 2. Near occlusive short-segment superficial thrombophlebitis involving a branch of the cephalic vein which reportedly contains a peripheral IV. There is no extension of this short-segment SVT  to the deep venous system of the left upper extremity. Electronically Signed   By: Simonne Come M.D.   On: 09/27/2018 10:25    Pulmonary embolism, bilateral [facility]) #18 year old female patient currently in the hospital for worsening shortness of breath/chest pain noted to have bilateral PE  #Bilateral pulmonary embolism segmental arteries-without any hemodynamic instability.  Clinically stable/room air oxygen.  Question provoked BCP/family history of protein S deficiency.  Agree with transitioning from IV heparin to oral Eliquis.   #Hemoptysis likely secondary to pulmonary infarction-currently resolved.  Recommendation:    #Await hypercoagulable work-up.  Recommend in general anticoagulation around 6 to 12 months.  However, patient recommendation for final length of anticoagulation will be based upon results of above work-up.   #Had a long discussion regarding avoiding contact sport/and physical activity that could easily cause trauma/falls.  Discussed bleeding precautions detail.  #Patient will follow-up with me in approximately 2 weeks in the clinic/review the results of the hypercoagulable work-up.   Thank you Dr.Shah for allowing me to participate in the care of your pleasant patient. Please do not hesitate to contact me with questions or concerns in the interim.  Discussed with Dr. Clelia Croft.   # 60 minutes face-to-face with the patient discussing the above plan of care; more than 50% of time spent on prognosis/ natural history; counseling and coordination.    All questions were answered. The patient knows to call the clinic with any problems, questions or concerns.    Earna Coder, MD 09/28/2018 6:04 PM

## 2018-09-28 NOTE — Progress Notes (Addendum)
ANTICOAGULATION CONSULT NOTE - Initial Consult  Pharmacy Consult for apixaban  Indication: pulmonary embolus  No Known Allergies  Patient Measurements: Height: 5\' 7"  (170.2 cm) Weight: 150 lb (68 kg) IBW/kg (Calculated) : 61.6   Vital Signs: Temp: 98.6 F (37 C) (02/29 0427) Temp Source: Oral (02/29 0427) BP: 128/78 (02/29 0427) Pulse Rate: 67 (02/29 0427)  Labs: Recent Labs    09/26/18 1823 09/26/18 1906  09/27/18 0120 09/27/18 0730 09/27/18 1944 09/28/18 0639  HGB 12.8  --   --  12.2  --   --  13.3  HCT 38.5  --   --  36.5  --   --  41.0  PLT 316  --   --  306  --   --  307  APTT  --  28  --   --   --   --   --   LABPROT 11.6  --   --   --   --   --   --   INR 0.9  --   --   --   --   --   --   HEPARINUNFRC  --   --    < > 0.56 0.68 0.46 0.54  CREATININE 0.77  --   --  0.70  --   --  0.81  TROPONINI <0.03  --   --   --   --   --   --    < > = values in this interval not displayed.    Estimated Creatinine Clearance: 108.6 mL/min (by C-G formula based on SCr of 0.81 mg/dL).   Medical History: Past Medical History:  Diagnosis Date  . Cellulitis of face     Assessment: 18 yo female here with PE transitioning off heparin drip to apixaban  Goal of Therapy:  Monitor platelets by anticoagulation protocol: Yes   Plan:  Stop heparin at time of first apixaban dose Start apixaban 10 mg PO BID x7 days then 5 mg BID Will need SCr and CBC at least every three days if pt still here Hgb/plt count today is stable from yesterday Informed RN to give first dose of apixaban when heparin drip is turned off.   Crist Fat L 09/28/2018,7:38 AM

## 2018-09-28 NOTE — Telephone Encounter (Signed)
Please have the patient follow-up with me in appx.  2 weeks to review labs/results.; no labs.

## 2018-09-28 NOTE — Discharge Summary (Signed)
[facility] [place] at [facility]   PATIENT NAME: Melinda MatesLily Robles    MR#:  [id number]  DATE OF BIRTH:  [birth year]  DATE OF ADMISSION:  09/26/2018   ADMITTING PHYSICIAN: Auburn BilberryShreyang Patel, MD  DATE OF DISCHARGE: 09/28/2018 12:05 PM  PRIMARY CARE PHYSICIAN: Jolene ProvostPatel, Kirtida, MD   ADMISSION DIAGNOSIS:  Swelling [R60.9] Pulmonary emboli (HCC) [I26.99] Hemoptysis [R04.2] Multiple subsegmental pulmonary emboli without acute cor pulmonale [I26.94] DISCHARGE DIAGNOSIS:  Active Problems:   Pulmonary emboli (HCC)  SECONDARY DIAGNOSIS:   Past Medical History:  Diagnosis Date  . Cellulitis of face    HOSPITAL COURSE:   Melinda MatesLily Robles is a 18 year old female with no significant medical history, on an oral contraceptive pill, family history of PE/DVT and Protein S deficiency in her mother. She presented with shortness of breath and hemoptysis. Diagnosed with pulmonary embolism. Initially on heparin drip, transitioned to oral anticoagulation with Eliquis on 09/28/2018. OCP stopped.  Plan  1.Pulmonary embolism  Associated hemoptysis Patient presented with complaints of shortness of breath with some hemoptysis. One episode of small-volume hemoptysis mixed with sputum at discharge. Counseled on this as a normal symptom. Provided further education on PE, anticoagulation side effects, alarm symptoms for heavy bleeding and what to do if it should occur. Oxygen saturation of 100% on room air this morning. As above, patient has been on birth control pills.  Also has family history positive for protein S deficiency.  Hypercoagulability work-up was requested in the emergency room and will be followed up on outpatient. Patient was started on heparin drip. Admitted to medical service. Pharmacy followed for dosing/monitoring.  Patient seen by pulmonary service yesterday morning. Dr. Belia HemanKasa recommended hematology consultation to see patient prior to discharge.  Dr. Brahmanday/hematology saw  patient. Recommended oral anticoagulation with outpatient follow-up with hematology 1-2 weeks to assess coagulation workup. Avoidance of strenuous activity until follow-up. 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 Transitioned from heparin drip to Eliquis prior to discharge from the hospital. 1. Continue Eliquis 10 mg twice daily for 7 days followed by 5 mg twice daily until told to stop by hematologist, 3-6 months verbalized by hematologist at discharge. 30 day supply provided 2. Stop oral contraceptive pill - discussed that she will follow up with PCP to discuss other potential methods of non-systemic birth control such as an IUD etc, educated on using barrier methods for contraception if sexually active for now.   [facility]. Follow up CBC and BMP/Scr labs at that time  DVT prophylaxis; not indicated, patient on heparin drip for PE treatment   Updated multiple family members including mother and father at bedside.  DISCHARGE CONDITIONS:  stable CONSULTS OBTAINED:  Treatment Team:  Jama Flavorsjie, Jude, MD  Dr. Donneta RombergBrahmanday, hematology DRUG ALLERGIES:  No Known Allergies DISCHARGE MEDICATIONS:   Allergies as of 09/28/2018   No Known Allergies     Medication List    STOP taking these medications   mupirocin ointment 2 % Commonly known as:  BACTROBAN   TRI-LO-SPRINTEC 0.18/0.215/0.25 MG-25 MCG tab Generic drug:  Norgestimate-Ethinyl Estradiol Triphasic     TAKE these medications   apixaban 5 MG Tabs tablet Commonly known as:  ELIQUIS Take 2 tablets (10 mg total) by mouth 2 (two) times daily for 7 days.   apixaban 5 MG Tabs tablet Commonly known as:  ELIQUIS Take 1 tablet (5 mg total) by mouth 2 (two) times daily. Start taking on:  October 05, 2018   vitamin B-12  1000 MCG tablet Commonly known as:  CYANOCOBALAMIN Take 1,000 mcg by mouth daily.        DISCHARGE INSTRUCTIONS:   DIET:  Regular diet DISCHARGE CONDITION:  Stable ACTIVITY:  Activity as  tolerated and avoid strenuous physical activity until hematology follow-up OXYGEN:  Home Oxygen: No.  Oxygen Delivery: room air DISCHARGE LOCATION:  home   If you experience worsening of your admission symptoms, develop shortness of breath, life threatening emergency, suicidal or homicidal thoughts you must seek medical attention immediately by calling 911 or calling your MD immediately if your symptoms are severe.  You Must read complete instructions/literature along with all the possible adverse reactions/side effects for all the medicines you take and that have been prescribed to you. Take any new medicines only after you have completely understood and accept all the possible adverse reactions/side effects.   Please note  You were cared for by a hospitalist during your hospital stay. If you have any questions about your discharge medications or the care you received while you were in the hospital after you are discharged, you can call the unit and asked to speak with the hospitalist on call if the hospitalist that took care of you is not available. Once you are discharged, your primary care physician will handle any further medical issues. Please note that NO REFILLS for any discharge medications will be authorized once you are discharged, as it is imperative that you return to your primary care physician (or establish a relationship with a primary care physician if you do not have one) for your aftercare needs so that they can reassess your need for medications and monitor your lab values.    On the day of Discharge:  VITAL SIGNS:  Blood pressure 120/73, pulse (!) 54, temperature (!) 97.5 F (36.4 C), temperature source Oral, resp. rate 19, height 5\' 7"  (1.702 m), weight 68 kg, last menstrual period 08/30/2018, SpO2 100 %. PHYSICAL EXAMINATION:  GENERAL:  18 y.o.-year-old patient lying in the bed with no acute distress.  EYES: Pupils equal, round, reactive to light and accommodation. No  scleral icterus. Extraocular muscles intact.  HEENT: Head atraumatic, normocephalic. Oropharynx and nasopharynx clear.  NECK:  Supple, no jugular venous distention. No thyroid enlargement, no tenderness.  LUNGS: Normal breath sounds bilaterally, no wheezing, rales,rhonchi or crepitation. No use of accessory muscles of respiration.  CARDIOVASCULAR: S1, S2 normal. No murmurs, rubs, or gallops.  ABDOMEN: Soft, non-tender, non-distended. Bowel sounds present. No organomegaly or mass.  EXTREMITIES: No pedal edema, cyanosis, or clubbing.  NEUROLOGIC: Cranial nerves II through XII are intact. Muscle strength 5/5 in all extremities. Sensation intact. Gait not checked.  PSYCHIATRIC: The patient is alert and oriented x 3.  SKIN: No obvious rash, lesion, or ulcer.  DATA REVIEW:   CBC Recent Labs  Lab 09/28/18 0639  WBC 6.6  HGB 13.3  HCT 41.0  PLT 307    Chemistries  Recent Labs  Lab 09/28/18 0639  NA 139  K 3.9  CL 107  CO2 24  GLUCOSE 92  BUN 14  CREATININE 0.81  CALCIUM 9.0  MG 2.3    RADIOLOGY:  Ct Angio Chest Pe W Or Wo Contrast  Result Date: 09/26/2018 CLINICAL DATA:  Hemoptysis x 3 days, SOB, recent staph. Infection to face, Pt is on Birth control EXAM: CT ANGIOGRAPHY CHEST WITH CONTRAST TECHNIQUE: Multidetector CT imaging of the chest was performed using the standard protocol during bolus administration of intravenous contrast. Multiplanar CT image reconstructions and MIPs  were obtained to evaluate the vascular anatomy. CONTRAST:  19mL OMNIPAQUE IOHEXOL 350 MG/ML SOLN COMPARISON:  Chest radiographs, 09/25/2018 FINDINGS: Cardiovascular: There is satisfactory opacification of the pulmonary arteries to the segmental level. There are several small pulmonary emboli. Pulmonary emboli are noted at the branch point of the right lower lobe pulmonary artery with a discrete pulmonary embolus noted in the lateral segmental branch to the right lower lobe. On the left, there is a pulmonary  embolus to a lingular branch segmental pulmonary artery. No other definite pulmonary emboli. Heart is normal in size and configuration. No pericardial effusion. Great vessels are within normal limits. Mediastinum/Nodes: No enlarged mediastinal, hilar, or axillary lymph nodes. Thyroid gland, trachea, and esophagus demonstrate no significant findings. Lungs/Pleura: Small right pleural effusion. There is a small area of airspace opacity at the lateral right lung base consistent with atelectasis or possibly infarction. There is a small patchy area of opacity in the posterolateral left lung base consistent with atelectasis. Lungs otherwise clear. No left pleural effusion. No pneumothorax. Upper Abdomen: Unremarkable. Musculoskeletal: No chest wall abnormality. No acute or significant osseous findings. Review of the MIP images confirms the above findings. IMPRESSION: 1. Small segmental pulmonary emboli, right lower lobe and left upper lobe lingula, as detailed. 2. Mild lateral right lower lobe airspace opacity the small area posterolateral left lung base opacity. This is most likely atelectasis. Infarction on the right is possible. 3. Small right pleural effusion. Electronically Signed   By: Amie Portland M.D.   On: 09/26/2018 17:50   US Venous Img Lower Bilateral  Result Date: 09/26/2018 CLINICAL DATA:  Pulmonary emboli EXAM: BILATERAL LOWER EXTREMITY VENOUS DOPPLER ULTRASOUND TECHNIQUE: Gray-scale sonography with graded compression, as well as color Doppler and duplex ultrasound were performed to evaluate the lower extremity deep venous systems from the level of the common femoral vein and including the common femoral, femoral, profunda femoral, popliteal and calf veins including the posterior tibial, peroneal and gastrocnemius veins when visible. The superficial great saphenous vein was also interrogated. Spectral Doppler was utilized to evaluate flow at rest and with distal augmentation maneuvers in the common  femoral, femoral and popliteal veins. COMPARISON:  None. FINDINGS: RIGHT LOWER EXTREMITY Common Femoral Vein: No evidence of thrombus. Normal compressibility, respiratory phasicity and response to augmentation. Saphenofemoral Junction: No evidence of thrombus. Normal compressibility and flow on color Doppler imaging. Profunda Femoral Vein: No evidence of thrombus. Normal compressibility and flow on color Doppler imaging. Femoral Vein: No evidence of thrombus. Normal compressibility, respiratory phasicity and response to augmentation. Popliteal Vein: No evidence of thrombus. Normal compressibility, respiratory phasicity and response to augmentation. Calf Veins: No evidence of thrombus. Normal compressibility and flow on color Doppler imaging. Superficial Great Saphenous Vein: No evidence of thrombus. Normal compressibility. Venous Reflux:  None. Other Findings:  None. LEFT LOWER EXTREMITY Common Femoral Vein: No evidence of thrombus. Normal compressibility, respiratory phasicity and response to augmentation. Saphenofemoral Junction: No evidence of thrombus. Normal compressibility and flow on color Doppler imaging. Profunda Femoral Vein: No evidence of thrombus. Normal compressibility and flow on color Doppler imaging. Femoral Vein: No evidence of thrombus. Normal compressibility, respiratory phasicity and response to augmentation. Popliteal Vein: No evidence of thrombus. Normal compressibility, respiratory phasicity and response to augmentation. Calf Veins: No evidence of thrombus. Normal compressibility and flow on color Doppler imaging. Superficial Great Saphenous Vein: No evidence of thrombus. Normal compressibility. Venous Reflux:  None. Other Findings:  None. IMPRESSION: No evidence of deep venous thrombosis. Electronically Signed   By:  Charlett Nose M.D.   On: 09/26/2018 21:49   US Venous Img Upper Bilat  Result Date: 09/27/2018 CLINICAL DATA:  History of pulmonary embolism, now with right shoulder  swelling and chest pain. Evaluate for DVT. EXAM: BILATERAL UPPER EXTREMITY VENOUS DOPPLER ULTRASOUND TECHNIQUE: Gray-scale sonography with graded compression, as well as color Doppler and duplex ultrasound were performed to evaluate the bilateral upper extremity deep venous systems from the level of the subclavian vein and including the jugular, axillary, basilic, radial, ulnar and upper cephalic vein. Spectral Doppler was utilized to evaluate flow at rest and with distal augmentation maneuvers. COMPARISON:  None. FINDINGS: RIGHT UPPER EXTREMITY Internal Jugular Vein: No evidence of thrombus. Normal compressibility, respiratory phasicity and response to augmentation. Subclavian Vein: No evidence of thrombus. Normal compressibility, respiratory phasicity and response to augmentation. Axillary Vein: No evidence of thrombus. Normal compressibility, respiratory phasicity and response to augmentation. Cephalic Vein: No evidence of thrombus. Normal compressibility, respiratory phasicity and response to augmentation. Basilic Vein: No evidence of thrombus. Normal compressibility, respiratory phasicity and response to augmentation. Brachial Veins: No evidence of thrombus. Normal compressibility, respiratory phasicity and response to augmentation. Radial Veins: No evidence of thrombus. Normal compressibility, respiratory phasicity and response to augmentation. Ulnar Veins: No evidence of thrombus. Normal compressibility, respiratory phasicity and response to augmentation. Venous Reflux:  None. Other Findings:  None. LEFT UPPER EXTREMITY Internal Jugular Vein: No evidence of thrombus. Normal compressibility, respiratory phasicity and response to augmentation. Subclavian Vein: No evidence of thrombus. Normal compressibility, respiratory phasicity and response to augmentation. Axillary Vein: No evidence of thrombus. Normal compressibility, respiratory phasicity and response to augmentation. Cephalic Vein: No evidence of  thrombus. Normal compressibility, respiratory phasicity and response to augmentation. Basilic Vein: No evidence of thrombus. Normal compressibility, respiratory phasicity and response to augmentation. Brachial Veins: No evidence of thrombus. Normal compressibility, respiratory phasicity and response to augmentation. Radial Veins: No evidence of thrombus. Normal compressibility, respiratory phasicity and response to augmentation. Ulnar Veins: No evidence of thrombus. Normal compressibility, respiratory phasicity and response to augmentation. Venous Reflux:  None. Other Findings: There is hypoechoic near occlusive short-segment thrombus within a branch of the left cephalic vein which reportedly contains a peripheral IV (images 51 through 56). IMPRESSION: 1. No evidence of DVT within either upper extremity. 2. Near occlusive short-segment superficial thrombophlebitis involving a branch of the cephalic vein which reportedly contains a peripheral IV. There is no extension of this short-segment SVT to the deep venous system of the left upper extremity. Electronically Signed   By: Simonne Come M.D.   On: 09/27/2018 10:25   Management plans discussed with the patient and/or family and they are in agreement.  CODE STATUS: Prior - Full code  TOTAL TIME TAKING CARE OF THIS PATIENT: 45 minutes.   Stormy Fabian PA-C on 09/28/2018 at 4:41 PM  Between 7am to 6pm - Pager - [phone]  After 6pm go to [email or web address] - Social research officer, government  [facility]  Office  [phone]  CC: Primary care physician; Jolene Provost, MD

## 2018-09-28 NOTE — Discharge Instructions (Signed)
Stop taking your oral birth control. Follow up with your primary doctor to discuss future options for contraception that have a lower risk of blood clots. Use barrier methods (such as condoms) if you are going to be sexually active.   Take Eliquis (apixaban) for treating the blood clots in your lungs. Take 10 mg (2 (two) 5 mg pills) twice a day for 7 days then take 5 mg twice a day until told to stop by the specialist you will be seeing. We will provide you with a month's supply.   Follow-up with your primary doctor to monitor your symptoms and follow up on your lab-work. We will provided a detailed summary of your hospital stay that will be available to your primary doctor.  Follow-up in 2 weeks with Dr. Sharlette [facility], this is the hematologist who saw you while you were in the hospital.  Until this follow-up visit, avoid strenuous activity/strenuous exercise.

## 2018-09-30 LAB — ANTIPHOSPHOLIPID SYNDROME EVAL, BLD
Anticardiolipin IgA: <9 APL U/mL (ref 0–11)
Anticardiolipin IgG: <9 GPL U/mL (ref 0–14)
Anticardiolipin IgM: <9 MPL U/mL (ref 0–12)
DRVVT: 27 s (ref 0.0–47.0)
PTT Lupus Anticoagulant: 28.3 s (ref 0.0–51.9)
Phosphatydalserine, IgA: 1 APS IgA (ref 0–20)
Phosphatydalserine, IgG: 9 GPS IgG (ref 0–11)
Phosphatydalserine, IgM: 9 MPS IgM (ref 0–25)

## 2018-10-01 ENCOUNTER — Ambulatory Visit (INDEPENDENT_AMBULATORY_CARE_PROVIDER_SITE_OTHER): Payer: BLUE CROSS/BLUE SHIELD | Admitting: Family Medicine

## 2018-10-01 ENCOUNTER — Encounter: Payer: Self-pay | Admitting: Family Medicine

## 2018-10-01 VITALS — BP 116/67 | HR 57 | Temp 98.0°F | Resp 14

## 2018-10-01 DIAGNOSIS — I2699 Other pulmonary embolism without acute cor pulmonale: Secondary | ICD-10-CM | POA: Diagnosis not present

## 2018-10-01 NOTE — Progress Notes (Signed)
Patient presents today for follow-up regarding her PEs.  Patient has started Eliquis.  She denies any problems with the medication thus far.  She denies any chest pain and hemoptysis since being discharged.  She has had some shortness of breath when walking long distances around campus.  Her plans are to follow-up with the hematologist on the hospital next week and possibly see another hematologist at [facility]. Coagulopathy blood work was drawn at the hospital prior to starting heparin.  Lab results have been starting to return.  Doppler ultrasounds were done on all extremities which were negative for DVT.  Her mother is here today with the patient who admits that she has protein S deficiency.  She has never had a DVT or PE.  She is not on any anticoagulation.  There are other family members on the mother's side that have had DVTs and PEs.  Patient was on OCPs for about 2 years.  ROS: Negative except mentioned above. Vitals as per Epic. 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 I have advised the patient to meet with counseling services if needed.  She will be going home for spring break to [place].  Patient does have schoolwork that she needs to catch up on and would rather try to do this instead of just standing at practices or weight sessions.  Encourage patient to talk to academic advisor and professors.  I will discuss above with athletic trainer.

## 2018-10-03 LAB — FACTOR 5 LEIDEN

## 2018-10-09 ENCOUNTER — Encounter: Payer: Self-pay | Admitting: Internal Medicine

## 2018-10-09 ENCOUNTER — Other Ambulatory Visit: Payer: Self-pay

## 2018-10-09 ENCOUNTER — Inpatient Hospital Stay: Payer: BLUE CROSS/BLUE SHIELD | Attending: Internal Medicine | Admitting: Internal Medicine

## 2018-10-09 VITALS — BP 116/76 | HR 66 | Resp 16 | Wt 156.4 lb

## 2018-10-09 DIAGNOSIS — I2699 Other pulmonary embolism without acute cor pulmonale: Secondary | ICD-10-CM

## 2018-10-09 DIAGNOSIS — Z832 Family history of diseases of the blood and blood-forming organs and certain disorders involving the immune mechanism: Secondary | ICD-10-CM | POA: Diagnosis not present

## 2018-10-09 NOTE — Assessment & Plan Note (Addendum)
#  Bilateral pulmonary embolism segmental arteries-likely provoked- while on BCPs/ ? Protein S def [antigen- slightly high; low activity].  #Had a long discussion the patient and family-that I would recommend at least 6 months of anticoagulation given the extensive bilateral PE.  I would agree discontinuation of birth control pills.  #With regards to protein S work-up-given the difficulty interpreting the current protein S results-I think it is reasonable to order the work-up again in about 6 months [after coming off anticoagulation].    # Given the young age of diagnosis/patient's preference to go back to playing soccer/equivocal protein S testing-I think is reasonable for second opinion at [facility]; [facility].  # Had a long discussion regarding avoiding contact sport/and physical activity that could easily cause trauma/falls.  Discussed bleeding precautions detail.  # # DISPOSITION: # referral to [facility] Dr.Moll- Dx; bil PE/ ? Protein S def. # follow up in mid-may-MD-cbc/bmp- Dr.B

## 2018-10-09 NOTE — Progress Notes (Signed)
[facility] CONSULT NOTE  Patient Care Team: Jolene Provost, MD as PCP - General (Family Medicine)  CHIEF COMPLAINTS/PURPOSE OF CONSULTATION: BILATERAL PULMONARY EMBOLISM:  # FEB 2020- BILATERAL PULMONARY EMBOLISM: segmental arteries; negative for bilateral lower extremity DVT- provoked- while on BCPs/ [hypercoagulable work-up prior to anticoagulation/during hospitalization] ? Protein S def [antigen- slightly high; low activity]; factor V leiden-NEG/ APS- NEG;   # FHx of Protein S deficiency [mother- no clots; uncle-indefinitely anti-coagulation]   No history exists.     HISTORY OF PRESENTING ILLNESS:  Melinda Robles 18 y.o.  female no personal history of blood clots; with a family history of blood clots was recently admitted to hospital for bilateral PE.   Patient was initially treated with IV heparin; and transitioned to Eliquis at discharge.  No obvious etiology noted; question PCP.  Currently both comfortable to stop.  Patient admits to mild shortness of breath on exertion.  Otherwise able to go up a flight of stairs without difficulty.  She has not resumed soccer/training.  Review of Systems  Constitutional: Negative for chills, diaphoresis, fever, malaise/fatigue and weight loss.  HENT: Negative for nosebleeds and sore throat.   Eyes: Negative for double vision.  Respiratory: Positive for shortness of breath. Negative for cough, hemoptysis, sputum production and wheezing.   Cardiovascular: Negative for chest pain, palpitations, orthopnea and leg swelling.  Gastrointestinal: Negative for abdominal pain, blood in stool, constipation, diarrhea, heartburn, melena, nausea and vomiting.  Genitourinary: Negative for dysuria, frequency and urgency.  Musculoskeletal: Negative for back pain and joint pain.  Skin: Negative.  Negative for itching and rash.  Neurological: Negative for dizziness, tingling, focal weakness, weakness and headaches.  Endo/Heme/Allergies: Does not  bruise/bleed easily.  Psychiatric/Behavioral: Negative for depression. The patient is not nervous/anxious and does not have insomnia.      MEDICAL HISTORY:  Past Medical History:  Diagnosis Date  . Cellulitis of face     SURGICAL HISTORY: Past Surgical History:  Procedure Laterality Date  . KNEE SURGERY Right 05/2015    SOCIAL HISTORY: Social History   Socioeconomic History  . Marital status: Single    Spouse name: Not on file  . Number of children: Not on file  . Years of education: Not on file  . Highest education level: Not on file  Occupational History  . Not on file  Social Needs  . Financial resource strain: Not on file  . Food insecurity:    Worry: Not on file    Inability: Not on file  . Transportation needs:    Medical: Not on file    Non-medical: Not on file  Tobacco Use  . Smoking status: Never Smoker  . Smokeless tobacco: Never Used  Substance and Sexual Activity  . Alcohol use: Yes  . Drug use: Not on file  . Sexual activity: Not on file  Lifestyle  . Physical activity:    Days per week: Not on file    Minutes per session: Not on file  . Stress: Not on file  Relationships  . Social connections:    Talks on phone: Not on file    Gets together: Not on file    Attends religious service: Not on file    Active member of club or organization: Not on file    Attends meetings of clubs or organizations: Not on file    Relationship status: Not on file  . Intimate partner violence:    Fear of current or ex partner: Not on file  Emotionally abused: Not on file    Physically abused: Not on file    Forced sexual activity: Not on file  Other Topics Concern  . Not on file  Social History Narrative  . Not on file    FAMILY HISTORY: Family History  Problem Relation Age of Onset  . Protein S deficiency Mother     ALLERGIES:  has No Known Allergies.  MEDICATIONS:  Current Outpatient Medications  Medication Sig Dispense Refill  . apixaban  (ELIQUIS) 5 MG TABS tablet Take 1 tablet (5 mg total) by mouth 2 (two) times daily. 60 tablet 2  . vitamin B-12 (CYANOCOBALAMIN) 1000 MCG tablet Take 1,000 mcg by mouth daily.     No current facility-administered medications for this visit.       Marland Kitchen  PHYSICAL EXAMINATION: ECOG PERFORMANCE STATUS: 1 - Symptomatic but completely ambulatory  Vitals:   10/09/18 1132  BP: 116/76  Pulse: 66  Resp: 16   Filed Weights   10/09/18 1132  Weight: 156 lb 6.4 oz (70.9 kg)    Physical Exam  Constitutional: She is oriented to person, place, and time and well-developed, well-nourished, and in no distress.  HENT:  Head: Normocephalic and atraumatic.  Mouth/Throat: Oropharynx is clear and moist. No oropharyngeal exudate.  Eyes: Pupils are equal, round, and reactive to light.  Neck: Normal range of motion. Neck supple.  Cardiovascular: Normal rate and regular rhythm.  Pulmonary/Chest: No respiratory distress. She has no wheezes.  Abdominal: Soft. Bowel sounds are normal. She exhibits no distension and no mass. There is no abdominal tenderness. There is no rebound and no guarding.  Musculoskeletal: Normal range of motion.        General: No tenderness or edema.  Neurological: She is alert and oriented to person, place, and time.  Skin: Skin is warm.  Psychiatric: Affect normal.     LABORATORY DATA:  I have reviewed the data as listed Lab Results  Component Value Date   WBC 6.6 09/28/2018   HGB 13.3 09/28/2018   HCT 41.0 09/28/2018   MCV 92.3 09/28/2018   PLT 307 09/28/2018   Recent Labs    09/06/18 0232 09/26/18 1823 09/27/18 0120 09/28/18 0639  NA 138 138 140 139  K 3.5 3.6 3.4* 3.9  CL 102 104 105 107  CO2 26 25 22 24   GLUCOSE 92 90 122* 92  BUN 16 15 14 14   CREATININE 0.94 0.77 0.70 0.81  CALCIUM 9.2 8.9 8.8* 9.0  GFRNONAA >60 >60 >60 >60  GFRAA >60 >60 >60 >60  PROT 7.4  --   --   --   ALBUMIN 4.6  --   --   --   AST 24  --   --   --   ALT 24  --   --   --    ALKPHOS 52  --   --   --   BILITOT 0.4  --   --   --     RADIOGRAPHIC STUDIES: I have personally reviewed the radiological images as listed and agreed with the findings in the report. Dg Chest 2 View  Result Date: 09/25/2018 CLINICAL DATA:  Cough and difficulty breathing EXAM: CHEST - 2 VIEW COMPARISON:  None. FINDINGS: Lungs are clear. The heart size and pulmonary vascularity are normal. No adenopathy. There is mild upper thoracic levoscoliosis. IMPRESSION: No edema or consolidation. Electronically Signed   By: Bretta Bang III M.D.   On: 09/25/2018 14:42   Ct Angio Chest  Pe W Or Wo Contrast  Result Date: 09/26/2018 CLINICAL DATA:  Hemoptysis x 3 days, SOB, recent staph. Infection to face, Pt is on Birth control EXAM: CT ANGIOGRAPHY CHEST WITH CONTRAST TECHNIQUE: Multidetector CT imaging of the chest was performed using the standard protocol during bolus administration of intravenous contrast. Multiplanar CT image reconstructions and MIPs were obtained to evaluate the vascular anatomy. CONTRAST:  75mL OMNIPAQUE IOHEXOL 350 MG/ML SOLN COMPARISON:  Chest radiographs, 09/25/2018 FINDINGS: Cardiovascular: There is satisfactory opacification of the pulmonary arteries to the segmental level. There are several small pulmonary emboli. Pulmonary emboli are noted at the branch point of the right lower lobe pulmonary artery with a discrete pulmonary embolus noted in the lateral segmental branch to the right lower lobe. On the left, there is a pulmonary embolus to a lingular branch segmental pulmonary artery. No other definite pulmonary emboli. Heart is normal in size and configuration. No pericardial effusion. Great vessels are within normal limits. Mediastinum/Nodes: No enlarged mediastinal, hilar, or axillary lymph nodes. Thyroid gland, trachea, and esophagus demonstrate no significant findings. Lungs/Pleura: Small right pleural effusion. There is a small area of airspace opacity at the lateral right lung  base consistent with atelectasis or possibly infarction. There is a small patchy area of opacity in the posterolateral left lung base consistent with atelectasis. Lungs otherwise clear. No left pleural effusion. No pneumothorax. Upper Abdomen: Unremarkable. Musculoskeletal: No chest wall abnormality. No acute or significant osseous findings. Review of the MIP images confirms the above findings. IMPRESSION: 1. Small segmental pulmonary emboli, right lower lobe and left upper lobe lingula, as detailed. 2. Mild lateral right lower lobe airspace opacity the small area posterolateral left lung base opacity. This is most likely atelectasis. Infarction on the right is possible. 3. Small right pleural effusion. Electronically Signed   By: Amie Portland M.D.   On: 09/26/2018 17:50   US Venous Img Lower Bilateral  Result Date: 09/26/2018 CLINICAL DATA:  Pulmonary emboli EXAM: BILATERAL LOWER EXTREMITY VENOUS DOPPLER ULTRASOUND TECHNIQUE: Gray-scale sonography with graded compression, as well as color Doppler and duplex ultrasound were performed to evaluate the lower extremity deep venous systems from the level of the common femoral vein and including the common femoral, femoral, profunda femoral, popliteal and calf veins including the posterior tibial, peroneal and gastrocnemius veins when visible. The superficial great saphenous vein was also interrogated. Spectral Doppler was utilized to evaluate flow at rest and with distal augmentation maneuvers in the common femoral, femoral and popliteal veins. COMPARISON:  None. FINDINGS: RIGHT LOWER EXTREMITY Common Femoral Vein: No evidence of thrombus. Normal compressibility, respiratory phasicity and response to augmentation. Saphenofemoral Junction: No evidence of thrombus. Normal compressibility and flow on color Doppler imaging. Profunda Femoral Vein: No evidence of thrombus. Normal compressibility and flow on color Doppler imaging. Femoral Vein: No evidence of thrombus.  Normal compressibility, respiratory phasicity and response to augmentation. Popliteal Vein: No evidence of thrombus. Normal compressibility, respiratory phasicity and response to augmentation. Calf Veins: No evidence of thrombus. Normal compressibility and flow on color Doppler imaging. Superficial Great Saphenous Vein: No evidence of thrombus. Normal compressibility. Venous Reflux:  None. Other Findings:  None. LEFT LOWER EXTREMITY Common Femoral Vein: No evidence of thrombus. Normal compressibility, respiratory phasicity and response to augmentation. Saphenofemoral Junction: No evidence of thrombus. Normal compressibility and flow on color Doppler imaging. Profunda Femoral Vein: No evidence of thrombus. Normal compressibility and flow on color Doppler imaging. Femoral Vein: No evidence of thrombus. Normal compressibility, respiratory phasicity and response to augmentation.  Popliteal Vein: No evidence of thrombus. Normal compressibility, respiratory phasicity and response to augmentation. Calf Veins: No evidence of thrombus. Normal compressibility and flow on color Doppler imaging. Superficial Great Saphenous Vein: No evidence of thrombus. Normal compressibility. Venous Reflux:  None. Other Findings:  None. IMPRESSION: No evidence of deep venous thrombosis. Electronically Signed   By: Charlett Nose M.D.   On: 09/26/2018 21:49   US Venous Img Upper Bilat  Result Date: 09/27/2018 CLINICAL DATA:  History of pulmonary embolism, now with right shoulder swelling and chest pain. Evaluate for DVT. EXAM: BILATERAL UPPER EXTREMITY VENOUS DOPPLER ULTRASOUND TECHNIQUE: Gray-scale sonography with graded compression, as well as color Doppler and duplex ultrasound were performed to evaluate the bilateral upper extremity deep venous systems from the level of the subclavian vein and including the jugular, axillary, basilic, radial, ulnar and upper cephalic vein. Spectral Doppler was utilized to evaluate flow at rest and with  distal augmentation maneuvers. COMPARISON:  None. FINDINGS: RIGHT UPPER EXTREMITY Internal Jugular Vein: No evidence of thrombus. Normal compressibility, respiratory phasicity and response to augmentation. Subclavian Vein: No evidence of thrombus. Normal compressibility, respiratory phasicity and response to augmentation. Axillary Vein: No evidence of thrombus. Normal compressibility, respiratory phasicity and response to augmentation. Cephalic Vein: No evidence of thrombus. Normal compressibility, respiratory phasicity and response to augmentation. Basilic Vein: No evidence of thrombus. Normal compressibility, respiratory phasicity and response to augmentation. Brachial Veins: No evidence of thrombus. Normal compressibility, respiratory phasicity and response to augmentation. Radial Veins: No evidence of thrombus. Normal compressibility, respiratory phasicity and response to augmentation. Ulnar Veins: No evidence of thrombus. Normal compressibility, respiratory phasicity and response to augmentation. Venous Reflux:  None. Other Findings:  None. LEFT UPPER EXTREMITY Internal Jugular Vein: No evidence of thrombus. Normal compressibility, respiratory phasicity and response to augmentation. Subclavian Vein: No evidence of thrombus. Normal compressibility, respiratory phasicity and response to augmentation. Axillary Vein: No evidence of thrombus. Normal compressibility, respiratory phasicity and response to augmentation. Cephalic Vein: No evidence of thrombus. Normal compressibility, respiratory phasicity and response to augmentation. Basilic Vein: No evidence of thrombus. Normal compressibility, respiratory phasicity and response to augmentation. Brachial Veins: No evidence of thrombus. Normal compressibility, respiratory phasicity and response to augmentation. Radial Veins: No evidence of thrombus. Normal compressibility, respiratory phasicity and response to augmentation. Ulnar Veins: No evidence of thrombus. Normal  compressibility, respiratory phasicity and response to augmentation. Venous Reflux:  None. Other Findings: There is hypoechoic near occlusive short-segment thrombus within a branch of the left cephalic vein which reportedly contains a peripheral IV (images 51 through 56). IMPRESSION: 1. No evidence of DVT within either upper extremity. 2. Near occlusive short-segment superficial thrombophlebitis involving a branch of the cephalic vein which reportedly contains a peripheral IV. There is no extension of this short-segment SVT to the deep venous system of the left upper extremity. Electronically Signed   By: Simonne Come M.D.   On: 09/27/2018 10:25    ASSESSMENT & PLAN:   Pulmonary embolism, bilateral (HCC) #Bilateral pulmonary embolism segmental arteries-likely provoked- while on BCPs/ ? Protein S def [antigen- slightly high; low activity].  #Had a long discussion the patient and family-that I would recommend at least 6 months of anticoagulation given the extensive bilateral PE.  I would agree discontinuation of birth control pills.  #With regards to protein S work-up-given the difficulty interpreting the current protein S results-I think it is reasonable to order the work-up again in about 6 months [after coming off anticoagulation].    # Given the young  age of diagnosis/patient's preference to go back to playing soccer/equivocal protein S testing-I think is reasonable for second opinion at [facility]; [facility].  # Had a long discussion regarding avoiding contact sport/and physical activity that could easily cause trauma/falls.  Discussed bleeding precautions detail.  # # DISPOSITION: # referral to [facility] Dr.Moll- Dx; bil PE/ ? Protein S def. # follow up in mid-may-MD-cbc/bmp- Dr.B    All questions were answered. The patient knows to call the clinic with any problems, questions or concerns.       Earna Coder, MD 10/15/2018 8:33 PM

## 2018-12-10 ENCOUNTER — Other Ambulatory Visit: Payer: Self-pay

## 2018-12-11 ENCOUNTER — Inpatient Hospital Stay (HOSPITAL_BASED_OUTPATIENT_CLINIC_OR_DEPARTMENT_OTHER): Payer: BLUE CROSS/BLUE SHIELD | Admitting: Internal Medicine

## 2018-12-11 ENCOUNTER — Other Ambulatory Visit: Payer: Self-pay

## 2018-12-11 ENCOUNTER — Encounter: Payer: Self-pay | Admitting: Internal Medicine

## 2018-12-11 ENCOUNTER — Inpatient Hospital Stay: Payer: BLUE CROSS/BLUE SHIELD | Attending: Internal Medicine

## 2018-12-11 DIAGNOSIS — I2699 Other pulmonary embolism without acute cor pulmonale: Secondary | ICD-10-CM

## 2018-12-11 DIAGNOSIS — Z7901 Long term (current) use of anticoagulants: Secondary | ICD-10-CM

## 2018-12-11 NOTE — Assessment & Plan Note (Signed)
#  Bilateral pulmonary embolism segmental arteries-likely provoked- while on BCPs/ ? Protein S def [antigen- slightly high; low activity].  #Patient is currently on Eliquis which she finished the next 2 weeks [Dec 25, 2018].  As per the recommendations from [facility] is recommend total of 3 months of anticoagulation.  #I reviewed the recommendations from [facility] at length.  I would defer further management/work-up to Dr. Shirlee More expertise.  # DISPOSITION:  #No follow-up recommended with us/patient understands to follow-up with [facility] otology.

## 2018-12-11 NOTE — Progress Notes (Signed)
I connected with Melinda Robles on 12/11/18 at 11:30 AM EDT by video enabled telemedicine visit and verified that I am speaking with the correct person using two identifiers.  I discussed the limitations, risks, security and privacy concerns of performing an evaluation and management service by telemedicine and the availability of in-person appointments. I also discussed with the patient that there may be a patient responsible charge related to this service. The patient expressed understanding and agreed to proceed.    Other persons participating in the visit and their role in the encounter: Mother/coordination of care Patient's location: Home Provider's location: office    No history exists.     Chief Complaint: Bilateral PE   History of present illness:Melinda Robles 18 y.o.  female with history of bilateral PE approximately 3 months ago follow-up.  In the interim patient was evaluated at [facility] hematology.   Patient continues to be on Eliquis 5 mg twice a day.  Symptoms of shoulder pain shortness of breath resolved.  Denies any blood in stools or black or stools.  Overall she feels back to baseline.  Observation/objective:  Assessment and plan: Pulmonary embolism, bilateral (HCC) #Bilateral pulmonary embolism segmental arteries-likely provoked- while on BCPs/ ? Protein S def [antigen- slightly high; low activity].  #Patient is currently on Eliquis which she finished the next 2 weeks [Dec 25, 2018].  As per the recommendations from [facility] is recommend total of 3 months of anticoagulation.  #I reviewed the recommendations from [facility] at length.  I would defer further management/work-up to Dr. Shirlee More expertise.  # DISPOSITION:  #No follow-up recommended with us/patient understands to follow-up with [facility] otology.   Follow-up instructions:  I discussed the assessment and treatment plan with the patient.  The patient was provided an opportunity to ask questions and all were answered.  The  patient agreed with the plan and demonstrated understanding of instructions.  The patient was advised to call back or seek an in person evaluation if the symptoms worsen or if the condition fails to improve as anticipated.   Dr. Louretta Shorten [facility] at [facility] 12/11/2018 12:23 PM

## 2018-12-31 ENCOUNTER — Other Ambulatory Visit (INDEPENDENT_AMBULATORY_CARE_PROVIDER_SITE_OTHER): Payer: Self-pay | Admitting: Family Medicine

## 2019-02-17 ENCOUNTER — Telehealth (INDEPENDENT_AMBULATORY_CARE_PROVIDER_SITE_OTHER): Payer: Self-pay | Admitting: Family Medicine

## 2019-02-17 NOTE — Telephone Encounter (Signed)
Attempted to call.  No answer.  ML on VM I'd called.  She had these labs done on 12/31/18, so need clarification as to whether she needs them repeated or if she just needs the results.

## 2019-02-17 NOTE — Telephone Encounter (Signed)
Patient said that she's been speaking with Dr Omar Person, and she needs to get blood work because she had a pulmonary embolism., listed below...  D-Dimer   protein s activity    Please call her [phone]

## 2019-02-18 NOTE — Telephone Encounter (Signed)
Attempted to call, pt answered but call disconnected. Called again, LMTCB #2- instructed to call back to clarify whether needing repeat labs or results of June labs (in [place])

## 2019-02-18 NOTE — Telephone Encounter (Signed)
lmfcb

## 2019-02-20 ENCOUNTER — Telehealth (INDEPENDENT_AMBULATORY_CARE_PROVIDER_SITE_OTHER): Payer: Self-pay | Admitting: Family Medicine

## 2019-02-20 ENCOUNTER — Other Ambulatory Visit (INDEPENDENT_AMBULATORY_CARE_PROVIDER_SITE_OTHER): Payer: Self-pay | Admitting: Family Medicine

## 2019-02-20 DIAGNOSIS — I2699 Other pulmonary embolism without acute cor pulmonale: Secondary | ICD-10-CM

## 2019-02-20 NOTE — Telephone Encounter (Signed)
Spoke with patient.  She needs D-dimer and Protein S activity repeated.  Orders entered, she will pick up at [facility] desk.

## 2019-02-20 NOTE — Telephone Encounter (Signed)
[place] pt:  Pt would like a call back to discuss orders she needs. She says these are tests that have to be done while she is off the bloodthinners. Please call her at [phone].

## 2019-02-23 LAB — PROTEIN S ACTIVITY: Protein S Activity: 43 % — ABNORMAL LOW (ref 63–140)

## 2019-02-23 LAB — D-DIMER, QUANTITATIVE: D-Dimer: 0.25 mg/L FEU (ref 0.00–0.49)

## 2019-02-24 NOTE — Progress Notes (Signed)
Called and spk with pt. (verified via DOB) pt informed of results.  No further action needed.

## 2019-02-28 ENCOUNTER — Encounter (INDEPENDENT_AMBULATORY_CARE_PROVIDER_SITE_OTHER): Payer: Self-pay

## 2019-03-05 LAB — ESOTERIX FACTOR II GENE MUTATION

## 2019-03-05 LAB — ESOTERIX PROTEIN S ACTIVITY (CLOTTABLE): Protein S Activity: 59 % — ABNORMAL LOW

## 2019-03-05 LAB — BETA-2 GLYCOPROTEIN I, IGA/G/M
Beta2-Glycoprotein I (IgA): <10 SAU
Beta2-Glycoprotein I (IgG): <10 SGU
Beta2-Glycoprotein I (IgM): <10 SMU

## 2019-03-05 LAB — PROTEIN S AG, TOTAL AND FREE
Protein S Antigen Free: 179 % — ABNORMAL HIGH
Protein S Antigen Total: 105 %

## 2019-03-05 LAB — D-DIMER, QUANTITATIVE: D-Dimer: <0.2 mg/L FEU (ref 0.00–0.49)

## 2019-04-23 ENCOUNTER — Other Ambulatory Visit: Payer: Self-pay

## 2019-04-23 DIAGNOSIS — Z20822 Contact with and (suspected) exposure to covid-19: Secondary | ICD-10-CM

## 2019-04-25 LAB — NOVEL CORONAVIRUS, NAA: SARS-CoV-2, NAA: NOT DETECTED

## 2019-09-15 ENCOUNTER — Other Ambulatory Visit: Payer: Self-pay | Admitting: Family Medicine

## 2019-09-15 DIAGNOSIS — Z8616 Personal history of COVID-19: Secondary | ICD-10-CM

## 2019-09-15 DIAGNOSIS — I400 Infective myocarditis: Secondary | ICD-10-CM

## 2019-09-15 DIAGNOSIS — U071 COVID-19: Secondary | ICD-10-CM

## 2019-09-25 ENCOUNTER — Ambulatory Visit (INDEPENDENT_AMBULATORY_CARE_PROVIDER_SITE_OTHER): Payer: BC Managed Care – PPO

## 2019-09-25 ENCOUNTER — Other Ambulatory Visit
Admission: RE | Admit: 2019-09-25 | Discharge: 2019-09-25 | Disposition: A | Discharge status: Home / Self Care | Payer: BC Managed Care – PPO | Attending: Family Medicine | Admitting: Family Medicine

## 2019-09-25 ENCOUNTER — Other Ambulatory Visit: Payer: Self-pay

## 2019-09-25 DIAGNOSIS — Z8616 Personal history of COVID-19: Secondary | ICD-10-CM

## 2019-09-25 DIAGNOSIS — U071 COVID-19: Secondary | ICD-10-CM | POA: Diagnosis not present

## 2019-09-25 DIAGNOSIS — I400 Infective myocarditis: Secondary | ICD-10-CM

## 2019-09-25 LAB — TROPONIN I (HIGH SENSITIVITY): Troponin I (High Sensitivity): 3 ng/L (ref <18)

## 2019-10-14 ENCOUNTER — Ambulatory Visit
Admission: RE | Admit: 2019-10-14 | Discharge: 2019-10-14 | Disposition: A | Discharge status: Home / Self Care | Payer: BC Managed Care – PPO | Source: Ambulatory Visit | Attending: Family Medicine | Admitting: Family Medicine

## 2019-10-14 ENCOUNTER — Other Ambulatory Visit: Payer: Self-pay

## 2019-10-14 ENCOUNTER — Ambulatory Visit
Admission: RE | Admit: 2019-10-14 | Discharge: 2019-10-14 | Disposition: A | Discharge status: Home / Self Care | Payer: BC Managed Care – PPO | Attending: Family Medicine | Admitting: Family Medicine

## 2019-10-14 ENCOUNTER — Other Ambulatory Visit: Payer: Self-pay | Admitting: Family Medicine

## 2019-10-14 DIAGNOSIS — R52 Pain, unspecified: Secondary | ICD-10-CM

## 2019-10-27 ENCOUNTER — Other Ambulatory Visit: Payer: Self-pay

## 2019-10-27 ENCOUNTER — Ambulatory Visit: Payer: BC Managed Care – PPO | Attending: Cardiology

## 2019-10-27 DIAGNOSIS — Z23 Encounter for immunization: Secondary | ICD-10-CM

## 2019-10-27 NOTE — Progress Notes (Signed)
   Covid-19 Vaccination Clinic  Name:  Melinda Robles    MRN: [id number] DOB: 08-15-2000  10/27/2019  Melinda Robles was observed post Covid-19 immunization for 15 minutes without incident. She was provided with Vaccine Information Sheet and instruction to access the V-Safe system.   Melinda Robles was instructed to call 911 with any severe reactions post vaccine: Marland Kitchen Difficulty breathing  . Swelling of face and throat  . A fast heartbeat  . A bad rash all over body  . Dizziness and weakness   Immunizations Administered    Name Date Dose VIS Date Route   Pfizer COVID-19 Vaccine 10/27/2019  6:20 PM 0.3 mL 07/11/2019 Intramuscular   Manufacturer: [employer], [employer]   Lot: [id number]   NDC: [address]-5

## 2019-11-17 ENCOUNTER — Ambulatory Visit: Payer: BC Managed Care – PPO | Attending: Internal Medicine

## 2019-11-17 DIAGNOSIS — Z23 Encounter for immunization: Secondary | ICD-10-CM

## 2019-11-17 NOTE — Progress Notes (Signed)
   Covid-19 Vaccination Clinic  Name:  Noriah Osgood    MRN: [id number] DOB: March 23, 2001  11/17/2019  Ms. Arrants was observed post Covid-19 immunization for 15 minutes without incident. She was provided with Vaccine Information Sheet and instruction to access the V-Safe system.   Ms. Macconnell was instructed to call 911 with any severe reactions post vaccine: Marland Kitchen Difficulty breathing  . Swelling of face and throat  . A fast heartbeat  . A bad rash all over body  . Dizziness and weakness   Immunizations Administered    Name Date Dose VIS Date Route   Pfizer COVID-19 Vaccine 11/17/2019  6:26 PM 0.3 mL 09/24/2018 Intramuscular   Manufacturer: [employer], [employer]   Lot: [id number]   NDC: [address]-9

## 2020-07-08 ENCOUNTER — Encounter (INDEPENDENT_AMBULATORY_CARE_PROVIDER_SITE_OTHER): Payer: Self-pay | Admitting: Family Medicine

## 2020-07-09 ENCOUNTER — Encounter (INDEPENDENT_AMBULATORY_CARE_PROVIDER_SITE_OTHER): Payer: Self-pay | Admitting: Family Medicine

## 2020-07-09 ENCOUNTER — Ambulatory Visit (INDEPENDENT_AMBULATORY_CARE_PROVIDER_SITE_OTHER): Payer: No Typology Code available for payment source | Admitting: Family Medicine

## 2020-07-09 VITALS — BP 110/70 | HR 56 | Temp 98.7°F | Resp 16 | Ht 67.0 in | Wt 152.0 lb

## 2020-07-09 DIAGNOSIS — R350 Frequency of micturition: Secondary | ICD-10-CM

## 2020-07-09 DIAGNOSIS — Z86711 Personal history of pulmonary embolism: Secondary | ICD-10-CM

## 2020-07-09 LAB — POCT URINALYSIS AUTOMATED (IAH)
Bilirubin, UA POCT: NEGATIVE
Blood, UA POCT: NEGATIVE
Glucose, UA POCT: NEGATIVE
Ketones, UA POCT: NEGATIVE mg/dL
Nitrite, UA POCT: NEGATIVE
PH, UA POCT: 6.5 (ref 4.6–8)
Protein, UA POCT: NEGATIVE mg/dL
Specific Gravity, UA POCT: <=1.005 mg/dL (ref 1.001–1.035)
Urine Leukocytes POCT: NEGATIVE
Urobilinogen, UA POCT: 0.2 mg/dL

## 2020-07-09 MED ORDER — APIXABAN 5 MG PO TABS
5.0000 mg | ORAL_TABLET | Freq: Two times a day (BID) | ORAL | 0 refills | Status: AC
Start: 2020-07-09 — End: 2020-07-24

## 2020-07-09 MED ORDER — NITROFURANTOIN MONOHYD MACRO 100 MG PO CAPS
100.0000 mg | ORAL_CAPSULE | Freq: Two times a day (BID) | ORAL | 0 refills | Status: AC
Start: 2020-07-09 — End: 2020-07-14

## 2020-07-09 NOTE — Patient Instructions (Addendum)
Please take 5 mg (one pill) of Eliquis a few hours before your flight and repeat in 12 hours if you have not arrived at your destination by then.    Your chemical test on your urine was normal, but given your symptoms, we have ordered a urine culture.  I have sent in a prescription for an antibiotic (nitrofurantoin) for you to take if the culture is positive.  If we have not gotten your results before you leave for [place], I'd recommend that you take the antibiotics with you and check your results via MyChart.    Please start the antibiotics if your symptoms worsen and please see a provider if you develop fever, chills, nausea or vomiting or back pain.

## 2020-07-09 NOTE — Progress Notes (Signed)
Subjective:      Patient ID: Katie Church is a 19 y.o. female.    Chief Complaint:  Chief Complaint   Patient presents with    Urinary Tract Infection Symptoms       HPI:  The patient presents with 2 concerns:    1.  Urinary frequency.  She has 4 days of urinary frequency and urgency with some suprapubic cramping.  No flank pain, hematuria, nausea or vomiting.  She had a UTI about a month ago and her symptoms are similar.    2.  History of PE.  She had bilateral PE's in 2020 and was on Eliquis for 3 months.  Her hematologist has advised her that she should take Eliquis if she is going to be on a flight for 6 hours or more.  She is traveling to [place] over the holidays and will also be studying abroad next semester.        Problem List:  Patient Active Problem List   Diagnosis    Exercise-induced asthma    History of pulmonary embolus (PE)       Current Medications:  No current outpatient medications on file.     No current facility-administered medications for this visit.       Allergies:  No Known Allergies    Past Medical History:  No past medical history on file.    Past Surgical History:  Past Surgical History:   Procedure Laterality Date    ARTHROSCOPY KNEE, MENISCUS REPAIR Right 05/29/2015    for torn right lateral meniscus       Family History:  Family History   Problem Relation Age of Onset    Hypertension Other     Colon cancer Other     Allergic rhinitis Other     Deep vein thrombosis Other        Social History:  Social History     Socioeconomic History    Marital status: Single     Spouse name: Not on file    Number of children: Not on file    Years of education: Not on file    Highest education level: Not on file   Occupational History    Not on file   Tobacco Use    Smoking status: Never Smoker    Smokeless tobacco: Not on file   Substance and Sexual Activity    Alcohol use: No    Drug use: Not on file    Sexual activity: Not on file   Other Topics Concern    Not on file   Social  History Narrative    Not on file     Social Determinants of Health     Financial Resource Strain:     Difficulty of Paying Living Expenses: Not on file   Food Insecurity:     Worried About Running Out of Food in the Last Year: Not on file    Ran Out of Food in the Last Year: Not on file   Transportation Needs:     Lack of Transportation (Medical): Not on file    Lack of Transportation (Non-Medical): Not on file   Physical Activity:     Days of Exercise per Week: Not on file    Minutes of Exercise per Session: Not on file   Stress:     Feeling of Stress : Not on file   Social Connections:     Frequency of Communication with Friends and Family: Not on file  Frequency of Social Gatherings with Friends and Family: Not on file    Attends Religious Services: Not on file    Active Member of Clubs or Organizations: Not on file    Attends Banker Meetings: Not on file    Marital Status: Not on file   Intimate Partner Violence:     Fear of Current or Ex-Partner: Not on file    Emotionally Abused: Not on file    Physically Abused: Not on file    Sexually Abused: Not on file   Housing Stability:     Unable to Pay for Housing in the Last Year: Not on file    Number of Places Lived in the Last Year: Not on file    Unstable Housing in the Last Year: Not on file       The following sections were reviewed this encounter by the provider:   Tobacco   Allergies   Meds   Problems   Med Hx   Surg Hx   Fam Hx          ROS:  Review of Systems   Constitutional: Negative for activity change, appetite change, chills and fever.   Genitourinary: Positive for pelvic pain and urgency. Negative for flank pain and hematuria.       Vitals:  Resp 16    Ht 1.702 m (5\' 7" )    Wt 68.9 kg (152 lb)    LMP 06/19/2020    BMI 23.81 kg/m      Objective:     Physical Exam:  Physical Exam  Constitutional:       General: She is not in acute distress.     Appearance: Normal appearance.   Cardiovascular:      Rate and Rhythm:  Normal rate and regular rhythm.      Heart sounds: No murmur heard.      Pulmonary:      Effort: Pulmonary effort is normal.      Breath sounds: Normal breath sounds.   Abdominal:      General: Abdomen is flat. Bowel sounds are normal.      Palpations: Abdomen is soft.      Tenderness: There is abdominal tenderness in the suprapubic area. There is no right CVA tenderness, left CVA tenderness or guarding.   Neurological:      Mental Status: She is alert.          Assessment:     1. History of pulmonary embolus (PE)  - apixaban (ELIQUIS) 5 MG; Take 1 tablet (5 mg total) by mouth 2 (two) times daily for 30 doses  Dispense: 30 tablet; Refill: 0    2. Urinary frequency  - POCT UA Clinitek AX (urine dipstick)  - Urine culture  - nitrofurantoin, macrocrystal-monohydrate, (MACROBID) 100 MG capsule; Take 1 capsule (100 mg total) by mouth 2 (two) times daily for 5 days  Dispense: 10 capsule; Refill: 0      Plan:     1. History of PE.  Take Eliquis 5 mg prior to flight and repeat in 12 hours if not at destination.  2. Urinary frequency.  UA negative, but given symptoms will check culture.  Patient is leaving the country and results may not be back in time, so given Rx for nitrofurantoin 100 mg BID for 5 days.  Follow up for new or worsening symptoms.    Doyle Askew, MD

## 2020-07-12 LAB — URINE CULTURE: Result 1:: NO GROWTH

## 2020-09-06 IMAGING — CT CT ANGIO CHEST
2 of 6 series · 18 of 46 positions shown · IV contrast (APPLIED)
Comparison: Chest radiographs, 09/25/2018

CLINICAL DATA: Hemoptysis x 3 days, SOB, recent staph. Infection to
face, Pt is on Birth control

EXAM:
CT ANGIOGRAPHY CHEST WITH CONTRAST
TECHNIQUE: Multidetector CT imaging of the chest was performed using the
standard protocol during bolus administration of intravenous
contrast. Multiplanar CT image reconstructions and MIPs were
obtained to evaluate the vascular anatomy.
CONTRAST:  75mL OMNIPAQUE IOHEXOL 350 MG/ML SOLN

[Series 5: thins · axial · 0.62mm/px · z∈[-295,-76]mm · 16 of 241 slices shown]
[im 11/241  lung]
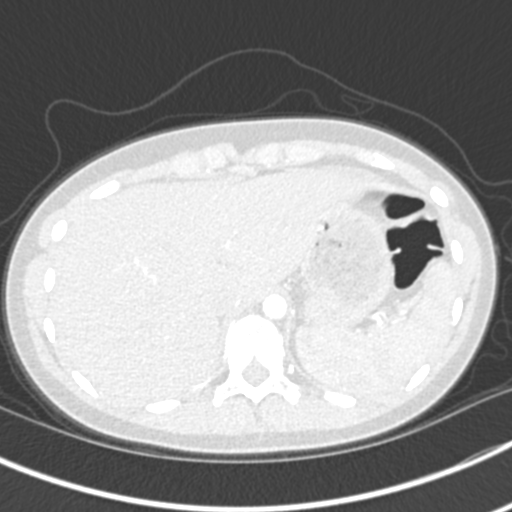
[im 32/241  soft-tissue]
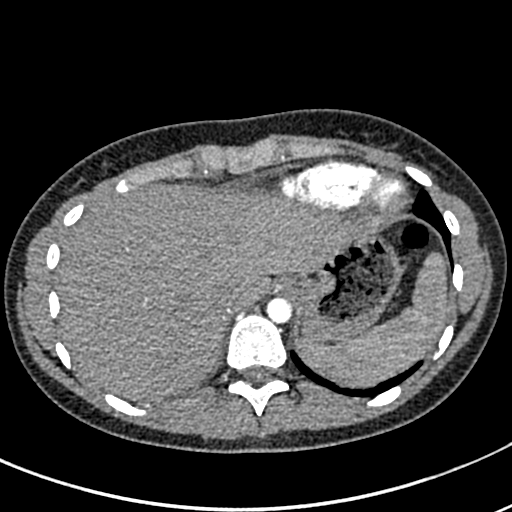
[im 42/241  lung]
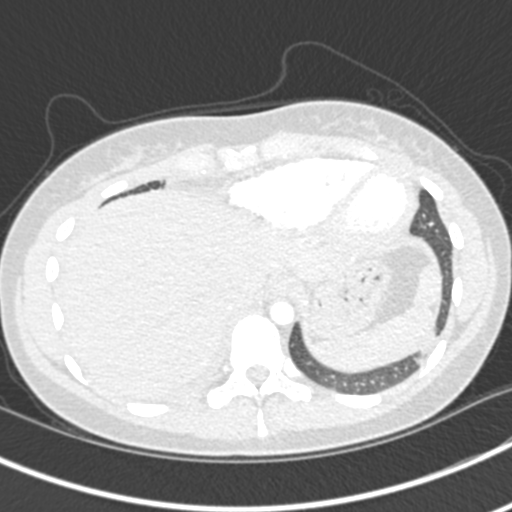
[im 53/241  soft-tissue]
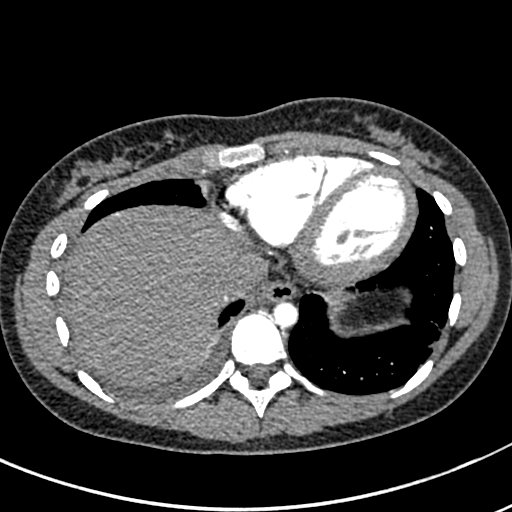
[im 74/241  lung]
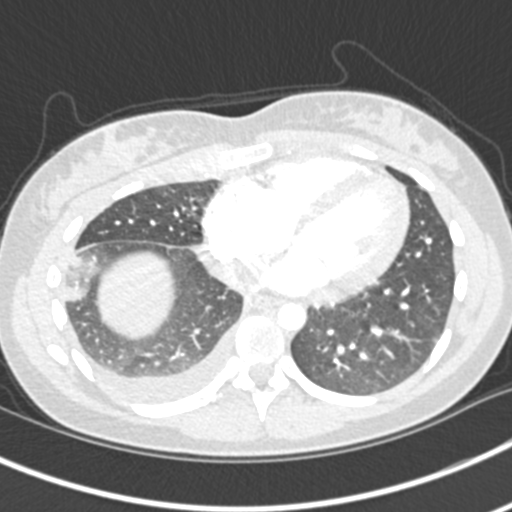
[im 84/241  soft-tissue]
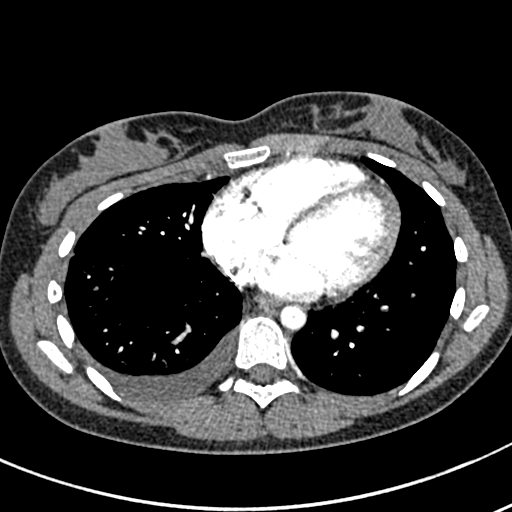
[im 94/241  lung]
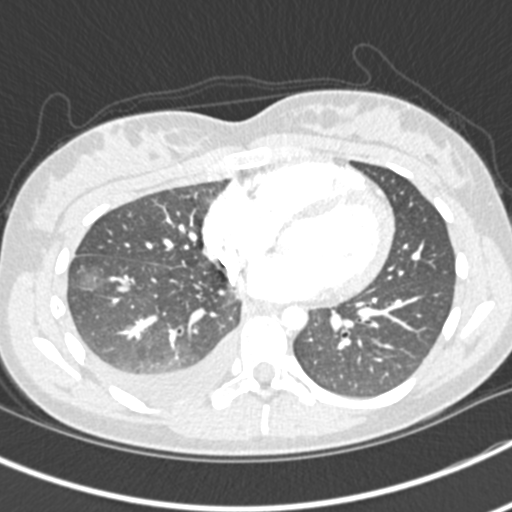
[im 115/241  soft-tissue]
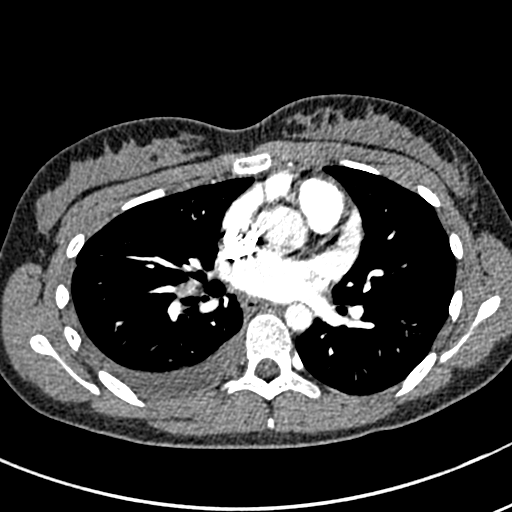
[im 126/241  lung]
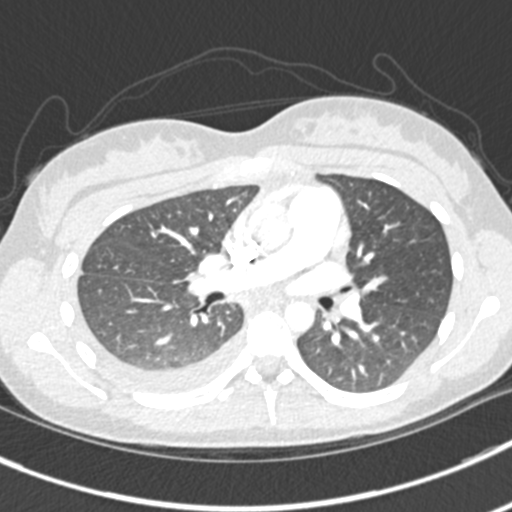
[im 147/241  soft-tissue]
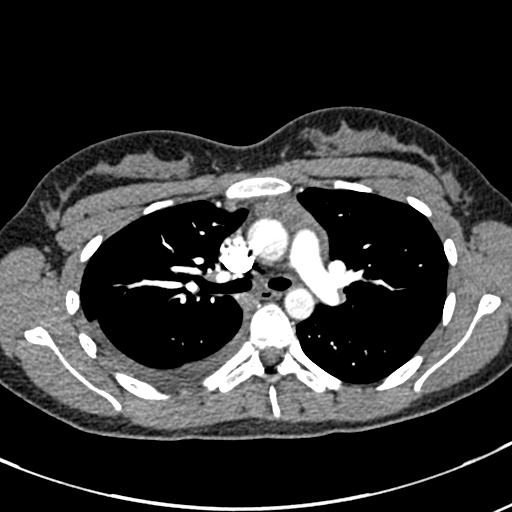
[im 157/241  lung]
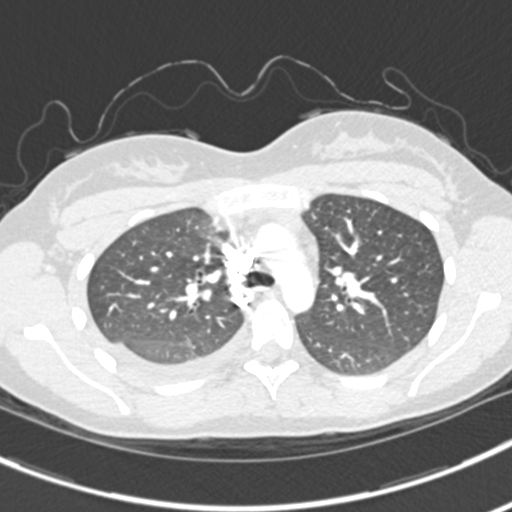
[im 167/241  soft-tissue]
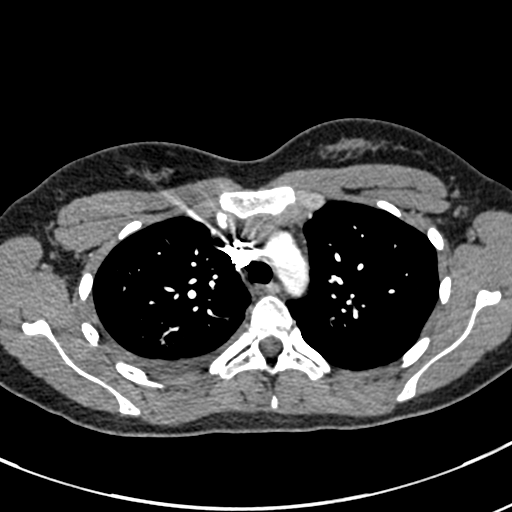
[im 188/241  lung]
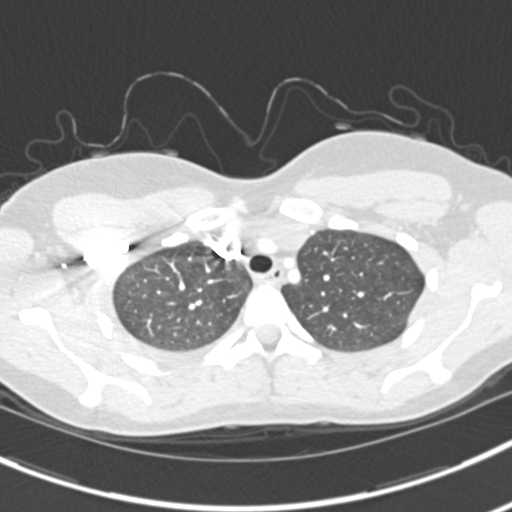
[im 199/241  soft-tissue]
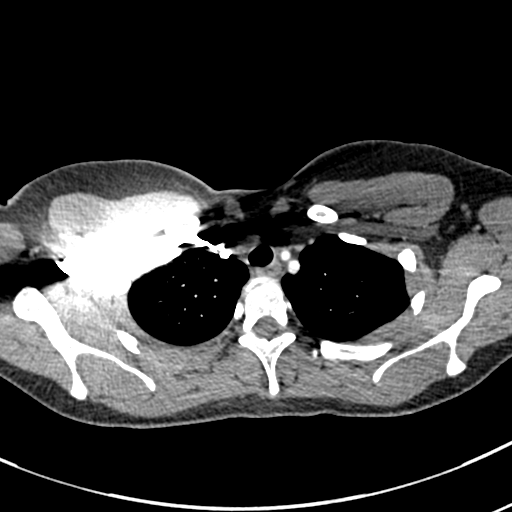
[im 209/241  lung]
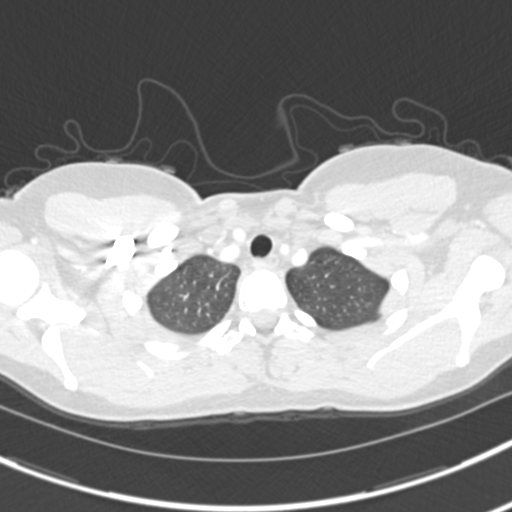
[im 230/241  soft-tissue]
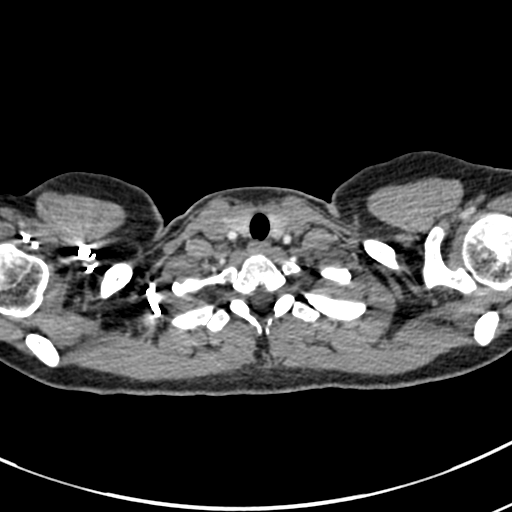

[Series 7: coronal mpr · coronal · 0.49mm/px · 2 of 67 slices shown]
[im 23/67  soft-tissue]
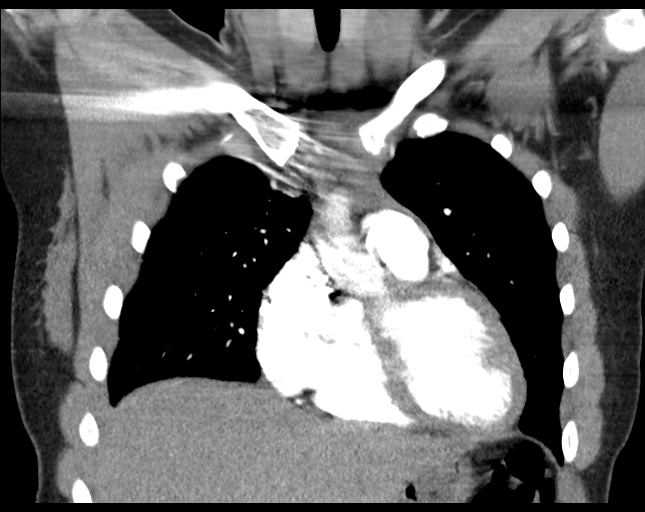
[im 45/67  soft-tissue]
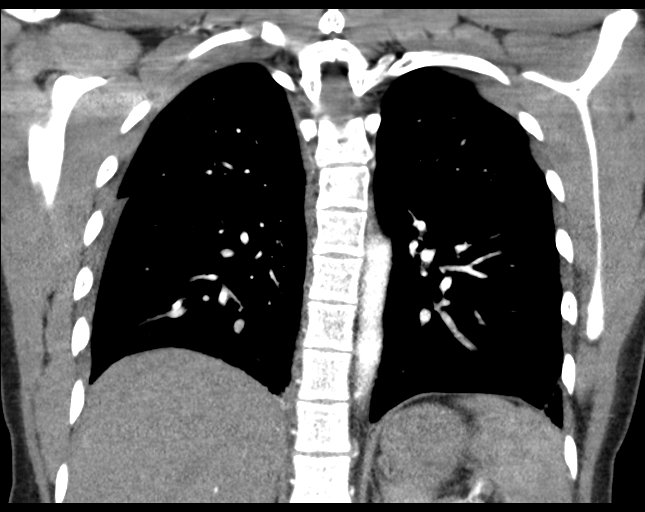

[18 of 46 positions shown; findings below may reference images not displayed]

FINDINGS: Cardiovascular: There is satisfactory opacification of the pulmonary
arteries to the segmental level.

There are several small pulmonary emboli. Pulmonary emboli are noted
at the branch point of the right lower lobe pulmonary artery with a
discrete pulmonary embolus noted in the lateral segmental branch to
the right lower lobe. On the left, there is a pulmonary embolus to a
lingular branch segmental pulmonary artery. No other definite
pulmonary emboli.

Heart is normal in size and configuration. No pericardial effusion.
Great vessels are within normal limits.

Mediastinum/Nodes: No enlarged mediastinal, hilar, or axillary lymph
nodes. Thyroid gland, trachea, and esophagus demonstrate no
significant findings.

Lungs/Pleura: Small right pleural effusion. There is a small area of
airspace opacity at the lateral right lung base consistent with
atelectasis or possibly infarction. There is a small patchy area of
opacity in the posterolateral left lung base consistent with
atelectasis. Lungs otherwise clear. No left pleural effusion. No
pneumothorax.

Upper Abdomen: Unremarkable.

Musculoskeletal: No chest wall abnormality. No acute or significant
osseous findings.

Review of the MIP images confirms the above findings.
IMPRESSION: 1. Small segmental pulmonary emboli, right lower lobe and left upper
lobe lingula, as detailed.
2. Mild lateral right lower lobe airspace opacity the small area
posterolateral left lung base opacity. This is most likely
atelectasis. Infarction on the right is possible.
3. Small right pleural effusion.

## 2020-09-07 IMAGING — US BILATERAL UPPER EXTREMITY VENOUS ULTRASOUND
1 series · 12 of 24 positions shown · non-contrast
Comparison: None.

CLINICAL DATA: History of pulmonary embolism, now with right
shoulder swelling and chest pain. Evaluate for DVT.

EXAM:
BILATERAL UPPER EXTREMITY VENOUS DOPPLER ULTRASOUND
TECHNIQUE: Gray-scale sonography with graded compression, as well as color
Doppler and duplex ultrasound were performed to evaluate the
bilateral upper extremity deep venous systems from the level of the
subclavian vein and including the jugular, axillary, basilic,
radial, ulnar and upper cephalic vein. Spectral Doppler was utilized
to evaluate flow at rest and with distal augmentation maneuvers.

[Series 1: bilateral upper extremity venous ultrasound · 0.06mm/px · 70 acquisitions, 12 frames shown]
[im 4/70]
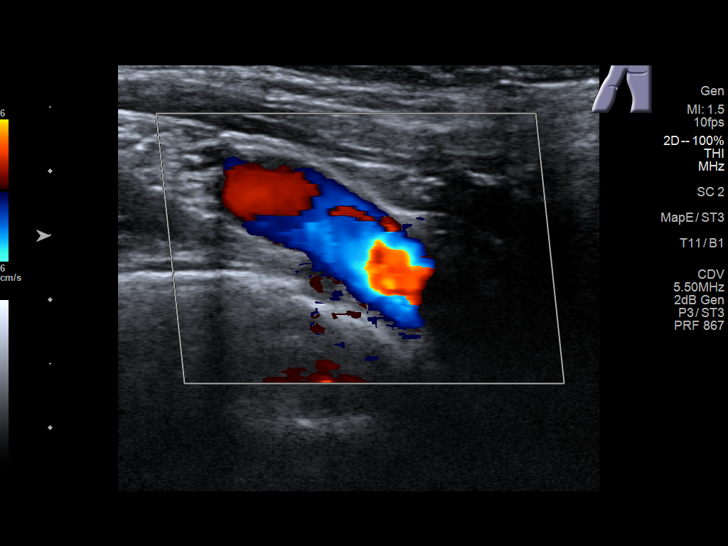
[im 10/70]
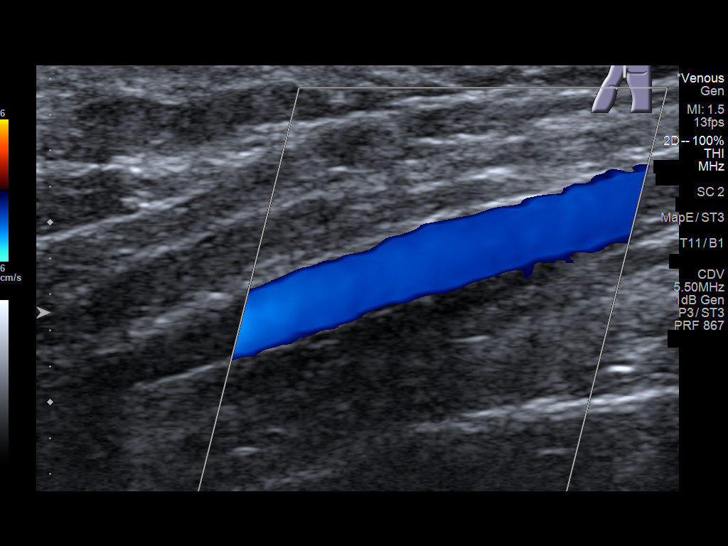
[im 16/70]
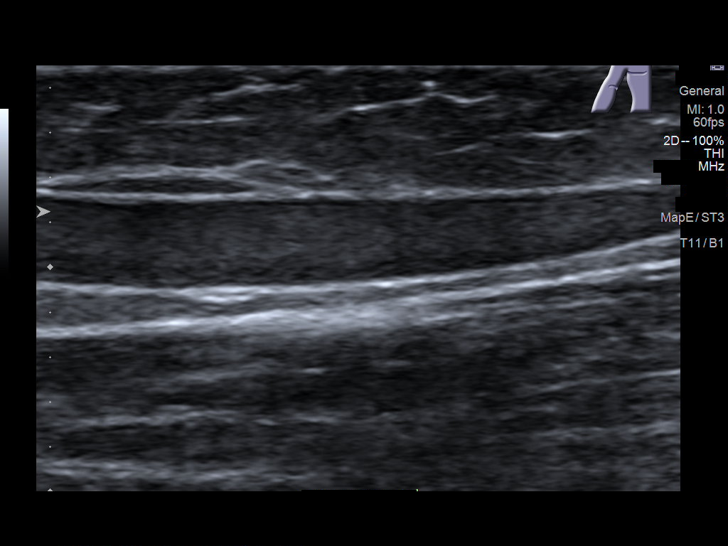
[im 22/70]
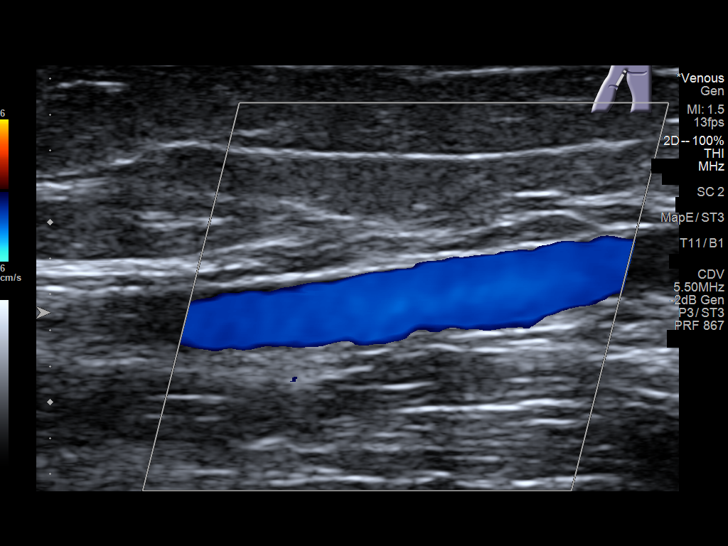
[im 28/70]
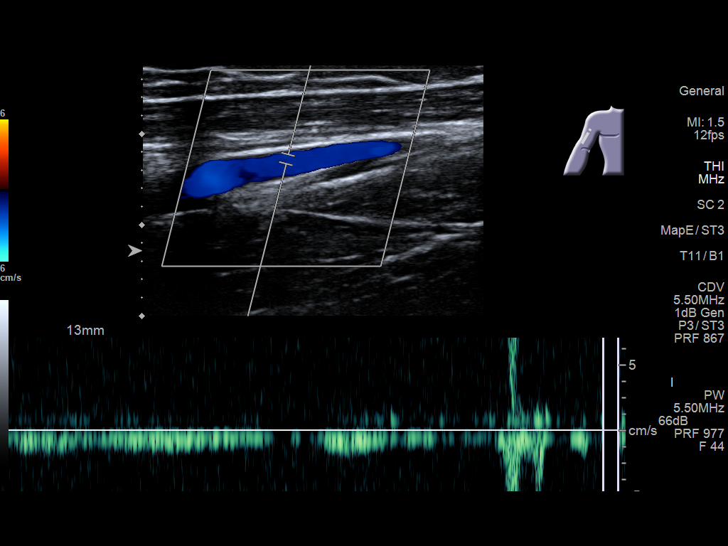
[im 34/70]
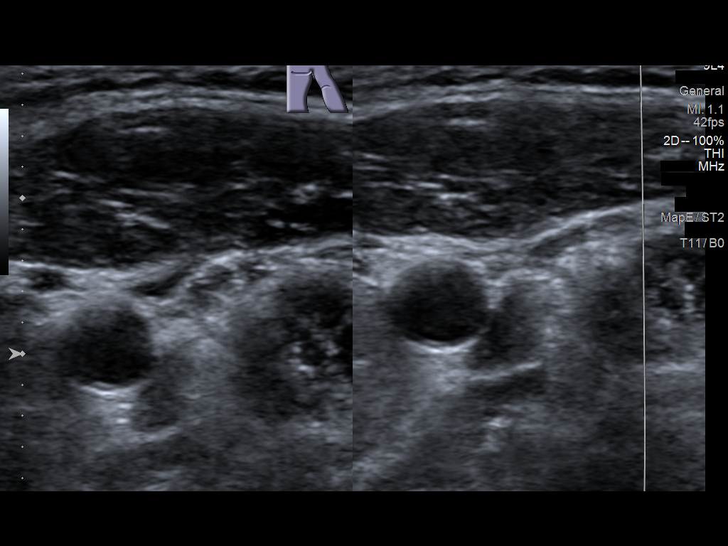
[im 40/70]
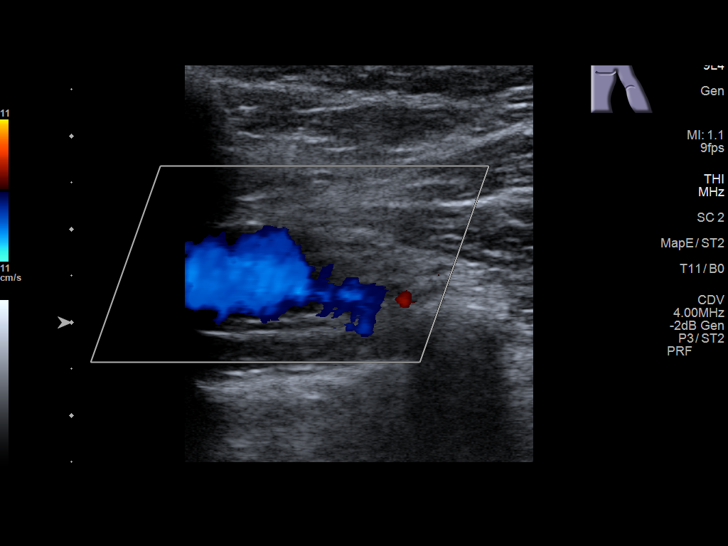
[im 46/70]
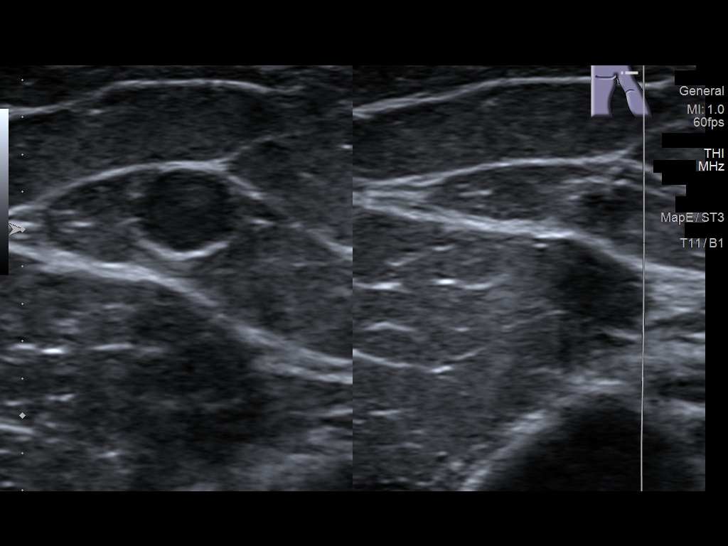
[im 52/70]
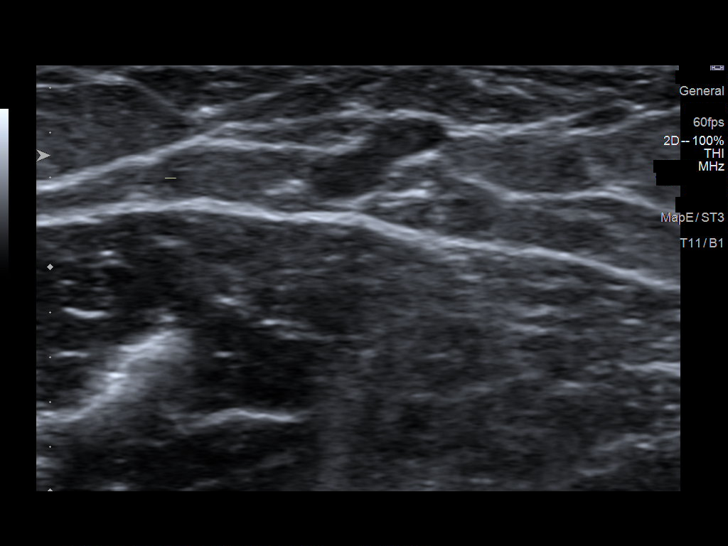
[im 58/70]
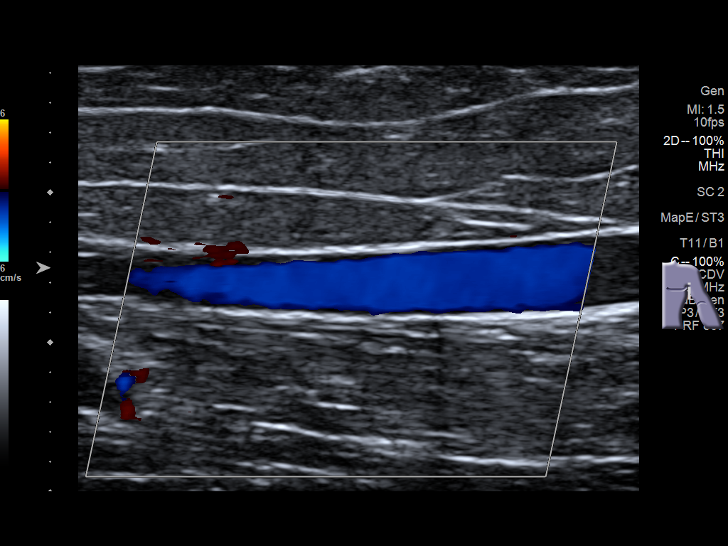
[im 64/70]
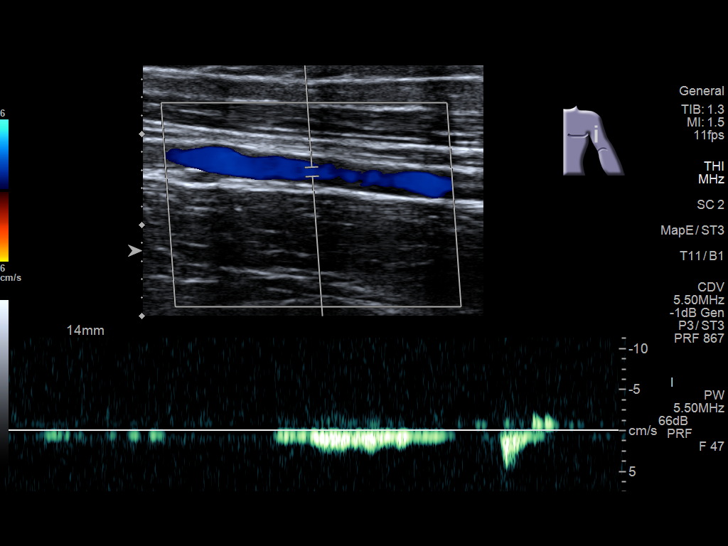
[im 70/70]
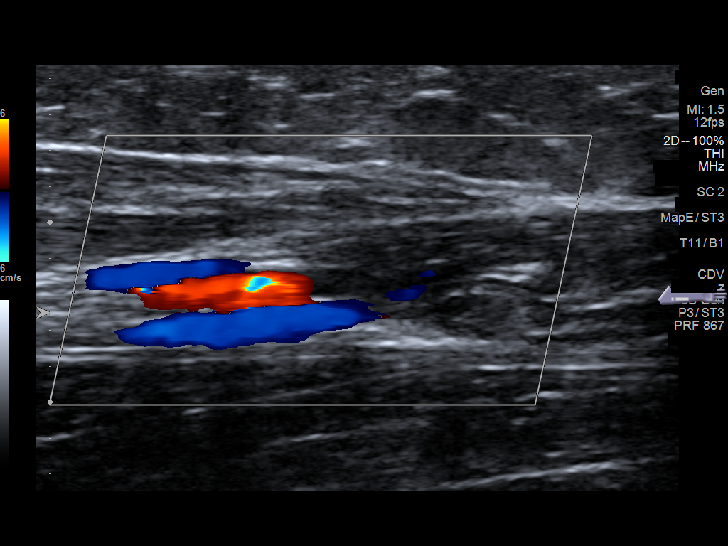

[12 of 24 positions shown; findings below may reference images not displayed]

FINDINGS: RIGHT UPPER EXTREMITY

Internal Jugular Vein: No evidence of thrombus. Normal
compressibility, respiratory phasicity and response to augmentation.

Subclavian Vein: No evidence of thrombus. Normal compressibility,
respiratory phasicity and response to augmentation.

Axillary Vein: No evidence of thrombus. Normal compressibility,
respiratory phasicity and response to augmentation.

Cephalic Vein: No evidence of thrombus. Normal compressibility,
respiratory phasicity and response to augmentation.

Basilic Vein: No evidence of thrombus. Normal compressibility,
respiratory phasicity and response to augmentation.

Brachial Veins: No evidence of thrombus. Normal compressibility,
respiratory phasicity and response to augmentation.

Radial Veins: No evidence of thrombus. Normal compressibility,
respiratory phasicity and response to augmentation.

Ulnar Veins: No evidence of thrombus. Normal compressibility,
respiratory phasicity and response to augmentation.

Venous Reflux:  None.

Other Findings:  None.

LEFT UPPER EXTREMITY

Internal Jugular Vein: No evidence of thrombus. Normal
compressibility, respiratory phasicity and response to augmentation.

Subclavian Vein: No evidence of thrombus. Normal compressibility,
respiratory phasicity and response to augmentation.

Axillary Vein: No evidence of thrombus. Normal compressibility,
respiratory phasicity and response to augmentation.

Cephalic Vein: No evidence of thrombus. Normal compressibility,
respiratory phasicity and response to augmentation.

Basilic Vein: No evidence of thrombus. Normal compressibility,
respiratory phasicity and response to augmentation.

Brachial Veins: No evidence of thrombus. Normal compressibility,
respiratory phasicity and response to augmentation.

Radial Veins: No evidence of thrombus. Normal compressibility,
respiratory phasicity and response to augmentation.

Ulnar Veins: No evidence of thrombus. Normal compressibility,
respiratory phasicity and response to augmentation.

Venous Reflux:  None.

Other Findings: There is hypoechoic near occlusive short-segment
thrombus within a branch of the left cephalic vein which reportedly
contains a peripheral IV (images 51 through 56).
IMPRESSION: 1. No evidence of DVT within either upper extremity.
2. Near occlusive short-segment superficial thrombophlebitis
involving a branch of the cephalic vein which reportedly contains a
peripheral IV. There is no extension of this short-segment SVT to
the deep venous system of the left upper extremity.

## 2021-09-24 IMAGING — CR DG CHEST 2V
1 series · 2 of 2 positions shown · non-contrast
Comparison: Chest x-ray 09/25/2018.

CLINICAL DATA: 19-year-old female with history of right-sided chest
pain for the past 4 days. Patient tested positive for [id number] on
09/10/2019. Remote prior history of pulmonary embolism.

EXAM:
CHEST - 2 VIEW

[Series 1: dg chest 2 view · 0.14mm/px · 2 of 2 slices shown]
[im 1/2]
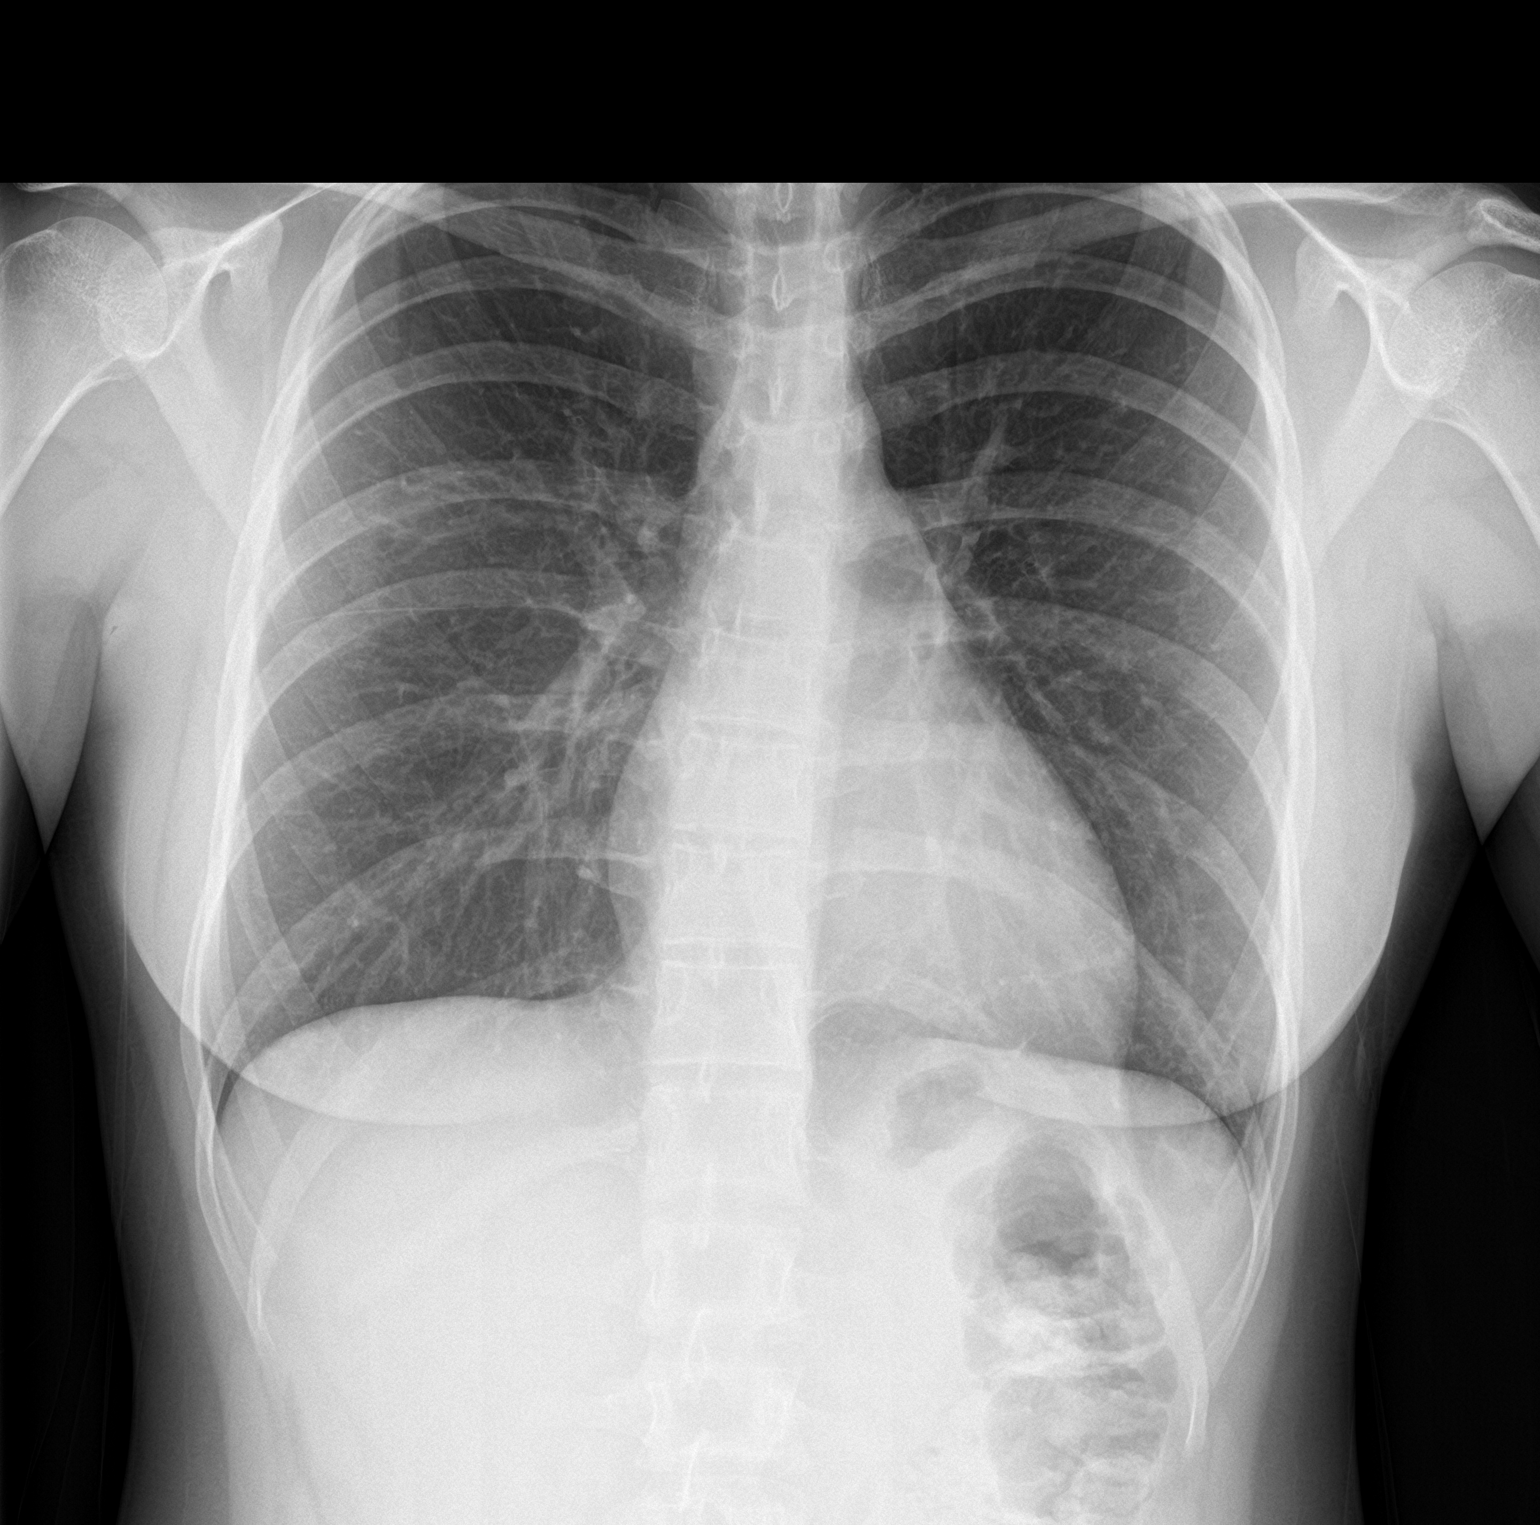
[im 2/2]
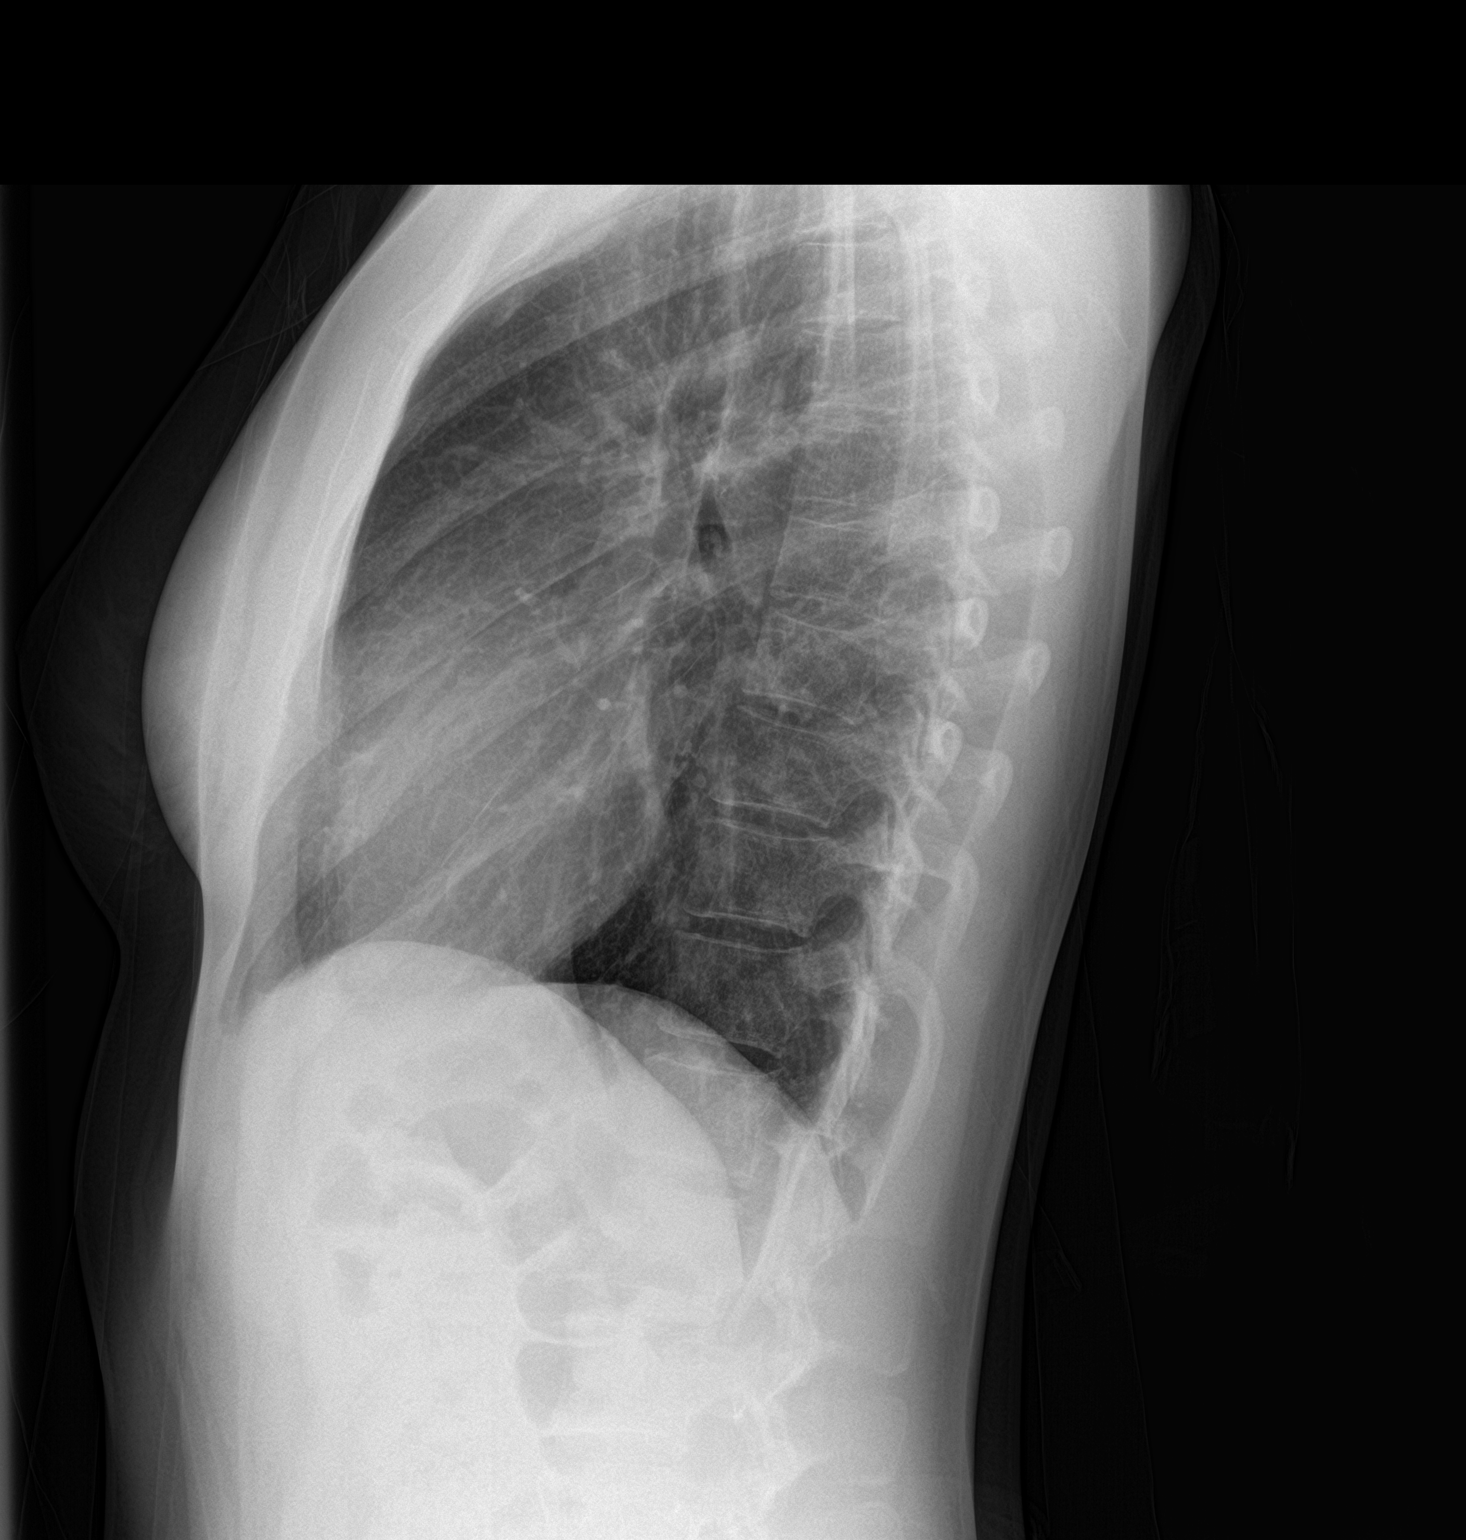

[2 of 2 positions shown; findings below may reference images not displayed]

FINDINGS: Lung volumes are normal. No consolidative airspace disease. No
pleural effusions. No pneumothorax. No pulmonary nodule or mass
noted. Pulmonary vasculature and the cardiomediastinal silhouette
are within normal limits.
IMPRESSION: No radiographic evidence of acute cardiopulmonary disease.

## 2021-11-14 ENCOUNTER — Encounter (INDEPENDENT_AMBULATORY_CARE_PROVIDER_SITE_OTHER): Payer: Self-pay | Admitting: Family Medicine

## 2021-11-14 ENCOUNTER — Ambulatory Visit (INDEPENDENT_AMBULATORY_CARE_PROVIDER_SITE_OTHER): Payer: No Typology Code available for payment source | Admitting: Family Medicine

## 2021-11-14 ENCOUNTER — Ambulatory Visit: Payer: Self-pay

## 2021-11-14 VITALS — BP 134/81 | HR 64 | Temp 97.5°F | Resp 16 | Ht 66.5 in | Wt 168.0 lb

## 2021-11-14 DIAGNOSIS — Z1159 Encounter for screening for other viral diseases: Secondary | ICD-10-CM

## 2021-11-14 DIAGNOSIS — Z124 Encounter for screening for malignant neoplasm of cervix: Secondary | ICD-10-CM

## 2021-11-14 DIAGNOSIS — F411 Generalized anxiety disorder: Secondary | ICD-10-CM

## 2021-11-14 DIAGNOSIS — Z01419 Encounter for gynecological examination (general) (routine) without abnormal findings: Secondary | ICD-10-CM

## 2021-11-14 DIAGNOSIS — Z23 Encounter for immunization: Secondary | ICD-10-CM

## 2021-11-14 DIAGNOSIS — Z113 Encounter for screening for infections with a predominantly sexual mode of transmission: Secondary | ICD-10-CM

## 2021-11-14 LAB — HEPATITIS C ANTIBODY: Hepatitis C, AB: NONREACTIVE

## 2021-11-14 LAB — THIN PREP-PAP W/IMAGING, REFLEX HPV DNA(HIGH RISK W/16/18)

## 2021-11-14 MED ORDER — SERTRALINE HCL 25 MG PO TABS
25.0000 mg | ORAL_TABLET | Freq: Every day | ORAL | 1 refills | Status: DC
Start: 2021-11-14 — End: 2022-03-13

## 2021-11-14 NOTE — Patient Instructions (Signed)
You received your Tdap (tetanus shot with whooping cough) and COVID boosters today.    Your pap smear was done.  We also included a test for gonorrhea and chlamydia, which is recommended for all women less than 25.    We ordered labs to check you for the Hepatitis C test, which is recommended for all adults 18 and up as a one-time test and does not have to be repeated if it's negative.    Please start taking sertraline (generic for Zoloft) 1/2 pill daily for a week, then increase to 1 full pill daily after that.  You should take your medication in the morning.  Please let me know how it's working.  I'd also recommend that you find a therapist who does Cognitive Behavioral Therapy (CBT) as this is one of the best therapies for anxiety.    It was great to see you, and best of luck with the rest of the Master's program!

## 2021-11-14 NOTE — Progress Notes (Signed)
[facility] [place] [facility] [place] [facility]                       Date of Exam: 11/14/2021 9:43 AM        Patient ID: Katie Church is a 21 y.o. female.  Attending Physician: Doyle Askew, MD        Chief Complaint:    Chief Complaint   Patient presents with    Anxiety    Annual Exam               HPI:    The patient presents for a routine wellness exam.  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    Her only concern is that she's been feeling somewhat anxious over the past year or so.  She's trying to find a therapist through her insurance, but hasn't yet.  She notes that her anxiety is interfering with her life.  She does note that she sometimes feels panicky in terms of feeling short of breath, but she doesn't experience full-blown panic attacks.  Her GAD-7 score is 12.    She'd like to get her Tdap and her COVID booster today.              Problem List:    Patient Active Problem List   Diagnosis    Exercise-induced asthma    History of pulmonary embolus (PE)             Current Meds:    No outpatient medications have been marked as taking for the 11/14/21 encounter (Appointment) with Omar Person Durenda Hurt, MD.          Allergies:    No Known Allergies          Past Surgical History:    Past Surgical History:   Procedure Laterality Date    ARTHROSCOPY KNEE, MENISCUS REPAIR Right 05/29/2015    for torn right lateral meniscus           Family History:    Family History   Problem Relation Age of Onset    Hypertension Other     Colon cancer Other     Allergic rhinitis Other     Deep vein thrombosis Other            Social History:    Social History     Tobacco Use    Smoking status: Never    Smokeless tobacco: Never   Substance Use Topics    Alcohol use: No           The following sections were reviewed this encounter by the provider:   Tobacco  Allergies  Meds  Problems  Med Hx  Surg Hx  Fam Hx             Vital Signs:    BP 134/81   Pulse 64   Temp  97.5 F (36.4 C)   Resp 16   Ht 1.689 m (5' 6.5")   Wt 76.2 kg (168 lb)   LMP 10/20/2021   BMI 26.71 kg/m          ROS:    Review of Systems   Constitutional:  Negative for activity change, appetite change and unexpected weight change.   Psychiatric/Behavioral:  The patient is nervous/anxious.               Physical Exam:    Physical Exam  Constitutional:  Appearance: Normal appearance.   HENT:      Head: Normocephalic and atraumatic.      Right Ear: Tympanic membrane and ear canal normal.      Left Ear: Tympanic membrane and ear canal normal.      Nose: No rhinorrhea.   Eyes:      Extraocular Movements: Extraocular movements intact.      Conjunctiva/sclera: Conjunctivae normal.      Pupils: Pupils are equal, round, and reactive to light.   Neck:      Thyroid: No thyroid mass or thyromegaly.      Vascular: No JVD.   Cardiovascular:      Rate and Rhythm: Normal rate and regular rhythm.      Pulses: Normal pulses.      Heart sounds: No murmur heard.  Pulmonary:      Effort: Pulmonary effort is normal.      Breath sounds: No wheezing, rhonchi or rales.   Abdominal:      General: Abdomen is flat.      Palpations: Abdomen is soft.      Tenderness: There is no abdominal tenderness.   Genitourinary:     General: Normal vulva.      Exam position: Lithotomy position.      Pubic Area: No rash.       Labia:         Right: No lesion.         Left: No lesion.       Urethra: No urethral lesion.      Vagina: Normal.      Cervix: Normal.   Musculoskeletal:         General: Normal range of motion.      Cervical back: Normal range of motion.      Right lower leg: No edema.      Left lower leg: No edema.   Lymphadenopathy:      Cervical: No cervical adenopathy.   Skin:     General: Skin is warm and dry.      Findings: No lesion.   Neurological:      General: No focal deficit present.      Mental Status: She is alert.   Psychiatric:         Mood and Affect: Mood normal.              Assessment:    1. Well woman exam with  routine gynecological exam    2. Screening for malignant neoplasm of cervix  - Thin Prep PAP w/Imaging, Reflex HPV DNA(high risk w/16/18    3. Need for hepatitis C screening test  - Hepatitis C (HCV) antibody, Total; Future  - Hepatitis C (HCV) antibody, Total    4. Screen for STD (sexually transmitted disease)  - Urine Chlamydia/Neisseria by PCR; Future  - Urine Chlamydia/Neisseria by PCR    5. Immunization due  - Tdap vaccine greater than or equal to 7yo IM  - COVID-19 mRNA BIVALENT vaccine BOOSTER 12 years and above (Pfizer) 30 mcg/0.3 mL    6. Generalized anxiety disorder  - sertraline (ZOLOFT) 25 MG tablet; Take 1 tablet (25 mg) by mouth daily  Dispense: 90 tablet; Refill: 1            Plan:      1. Recommend 5 servings of fruits and vegetables daily as part of a diet that also includes dairy, whole grains and lean meats with limited red meat.    2. Recommend  150 minutes of aerobic exercise per week or 10,000 steps daily.     3. Pap and GC/Chlamydia done.    4. Hep C ordered.    5. Tdap and COVID given.    6. Start sertraline 12.5 mg daily for one week, then increase to 25 mg daily. Also recommended CBT.            Follow-up:    No follow-ups on file.         Doyle Askew, MD

## 2021-11-15 ENCOUNTER — Encounter (INDEPENDENT_AMBULATORY_CARE_PROVIDER_SITE_OTHER): Payer: Self-pay | Admitting: Family Medicine

## 2021-11-15 LAB — URINE CHLAMYDIA/NEISSERIA BY PCR
Chlamydia DNA by PCR: NEGATIVE
Neisseria gonorrhoeae by PCR: NEGATIVE

## 2021-11-16 LAB — LAB USE ONLY - HISTORICAL GYN CYTOLOGY/PAP SMEAR

## 2022-03-07 ENCOUNTER — Encounter (INDEPENDENT_AMBULATORY_CARE_PROVIDER_SITE_OTHER): Payer: Self-pay | Admitting: Family Medicine

## 2022-03-07 NOTE — Progress Notes (Signed)
Hi,     Can we please schedule Katie Church for a virtual visit to discuss her medication refill please?    Thank you.    Asher Muir

## 2022-03-13 ENCOUNTER — Other Ambulatory Visit (INDEPENDENT_AMBULATORY_CARE_PROVIDER_SITE_OTHER): Payer: Self-pay | Admitting: Family Medicine

## 2022-03-13 DIAGNOSIS — F411 Generalized anxiety disorder: Secondary | ICD-10-CM

## 2022-03-13 MED ORDER — SERTRALINE HCL 50 MG PO TABS
75.0000 mg | ORAL_TABLET | Freq: Every day | ORAL | 1 refills | Status: DC
Start: 2022-03-13 — End: 2022-07-28

## 2022-06-29 ENCOUNTER — Encounter (INDEPENDENT_AMBULATORY_CARE_PROVIDER_SITE_OTHER): Payer: Self-pay | Admitting: Family Medicine

## 2022-07-28 ENCOUNTER — Other Ambulatory Visit (INDEPENDENT_AMBULATORY_CARE_PROVIDER_SITE_OTHER): Payer: Self-pay | Admitting: Family Medicine

## 2022-07-28 DIAGNOSIS — F411 Generalized anxiety disorder: Secondary | ICD-10-CM

## 2022-07-28 MED ORDER — SERTRALINE HCL 50 MG PO TABS
75.0000 mg | ORAL_TABLET | Freq: Every day | ORAL | 1 refills | Status: DC
Start: 2022-07-28 — End: 2023-08-02

## 2023-07-27 ENCOUNTER — Encounter (INDEPENDENT_AMBULATORY_CARE_PROVIDER_SITE_OTHER): Payer: Self-pay | Admitting: Family Medicine

## 2023-08-02 ENCOUNTER — Other Ambulatory Visit (INDEPENDENT_AMBULATORY_CARE_PROVIDER_SITE_OTHER): Payer: Self-pay | Admitting: Family Medicine

## 2023-08-02 DIAGNOSIS — F411 Generalized anxiety disorder: Secondary | ICD-10-CM

## 2023-08-02 MED ORDER — SERTRALINE HCL 50 MG PO TABS
75.0000 mg | ORAL_TABLET | Freq: Every day | ORAL | 0 refills | Status: AC
Start: 2023-08-02 — End: ?

## 2023-08-02 NOTE — Telephone Encounter (Signed)
RN sent 90 tablets of Zoloft to pharmacy
